# Patient Record
Sex: Female | Born: 1935 | Race: White | Hispanic: No | Marital: Married | Smoking: Never smoker
Health system: Southern US, Community
[De-identification: ages and names within clinical notes are randomized; demographics above are authoritative.]

## PROBLEM LIST (undated history)

## (undated) DIAGNOSIS — H409 Unspecified glaucoma: Secondary | ICD-10-CM

## (undated) DIAGNOSIS — I10 Essential (primary) hypertension: Secondary | ICD-10-CM

## (undated) DIAGNOSIS — I493 Ventricular premature depolarization: Secondary | ICD-10-CM

## (undated) DIAGNOSIS — K8020 Calculus of gallbladder without cholecystitis without obstruction: Secondary | ICD-10-CM

## (undated) DIAGNOSIS — K649 Unspecified hemorrhoids: Secondary | ICD-10-CM

## (undated) DIAGNOSIS — I4891 Unspecified atrial fibrillation: Secondary | ICD-10-CM

## (undated) DIAGNOSIS — K219 Gastro-esophageal reflux disease without esophagitis: Secondary | ICD-10-CM

## (undated) HISTORY — DX: Unspecified atrial fibrillation: I48.91

## (undated) HISTORY — DX: Unspecified hemorrhoids: K64.9

## (undated) HISTORY — DX: Essential (primary) hypertension: I10

## (undated) HISTORY — PX: CHOLECYSTECTOMY: SHX55

## (undated) HISTORY — PX: OTHER SURGICAL HISTORY: SHX169

## (undated) HISTORY — DX: Calculus of gallbladder without cholecystitis without obstruction: K80.20

## (undated) HISTORY — DX: Unspecified glaucoma: H40.9

## (undated) HISTORY — DX: Ventricular premature depolarization: I49.3

## (undated) HISTORY — DX: Gastro-esophageal reflux disease without esophagitis: K21.9

---

## 1972-09-07 HISTORY — PX: OTHER SURGICAL HISTORY: SHX169

## 1975-09-08 DIAGNOSIS — K8020 Calculus of gallbladder without cholecystitis without obstruction: Secondary | ICD-10-CM

## 1975-09-08 HISTORY — DX: Calculus of gallbladder without cholecystitis without obstruction: K80.20

## 1975-09-08 HISTORY — PX: BLADDER SURGERY: SHX569

## 1975-09-08 HISTORY — PX: OOPHORECTOMY: SHX86

## 2011-11-24 LAB — LIPID PANEL
Cholesterol: 168 mg/dL (ref 0–200)
HDL: 54 mg/dL (ref 35–70)
LDL Cholesterol: 99 mg/dL
Triglycerides: 95 mg/dL (ref 40–160)

## 2011-11-24 LAB — BASIC METABOLIC PANEL: Creatinine: 0.7 mg/dL (ref 0.5–1.1)

## 2011-11-24 LAB — HEPATIC FUNCTION PANEL: ALT: 17 U/L (ref 7–35)

## 2012-06-09 ENCOUNTER — Encounter: Payer: Self-pay | Admitting: Family Medicine

## 2012-06-09 ENCOUNTER — Ambulatory Visit (INDEPENDENT_AMBULATORY_CARE_PROVIDER_SITE_OTHER): Payer: Medicare Other | Admitting: Family Medicine

## 2012-06-09 VITALS — BP 168/90 | HR 80 | Ht 64.0 in | Wt 134.0 lb

## 2012-06-09 DIAGNOSIS — Z8679 Personal history of other diseases of the circulatory system: Secondary | ICD-10-CM | POA: Insufficient documentation

## 2012-06-09 DIAGNOSIS — I493 Ventricular premature depolarization: Secondary | ICD-10-CM | POA: Insufficient documentation

## 2012-06-09 DIAGNOSIS — Z23 Encounter for immunization: Secondary | ICD-10-CM

## 2012-06-09 DIAGNOSIS — H409 Unspecified glaucoma: Secondary | ICD-10-CM

## 2012-06-09 DIAGNOSIS — E538 Deficiency of other specified B group vitamins: Secondary | ICD-10-CM

## 2012-06-09 DIAGNOSIS — G43009 Migraine without aura, not intractable, without status migrainosus: Secondary | ICD-10-CM

## 2012-06-09 DIAGNOSIS — I4949 Other premature depolarization: Secondary | ICD-10-CM

## 2012-06-09 DIAGNOSIS — I4891 Unspecified atrial fibrillation: Secondary | ICD-10-CM

## 2012-06-09 DIAGNOSIS — D539 Nutritional anemia, unspecified: Secondary | ICD-10-CM

## 2012-06-09 DIAGNOSIS — R030 Elevated blood-pressure reading, without diagnosis of hypertension: Secondary | ICD-10-CM

## 2012-06-09 DIAGNOSIS — R252 Cramp and spasm: Secondary | ICD-10-CM

## 2012-06-09 MED ORDER — AMBULATORY NON FORMULARY MEDICATION: Status: DC

## 2012-06-09 NOTE — Progress Notes (Signed)
Subjective:    Patient ID: Kathleen Foster, female    DOB: 1931-08-13, 78 y.o.   MRN: [id number]  HPI Here to estab care.  She lives in a retirement community with her husband who has dementia, called [facility].  Has been having more foot cramps lately.  Tend to occur mostly at night. Will stand and put pressure on her foot adn it helps. Seem to be more frequent lately. No know exacerbating sxs.    She has a lot of questions about her vitamins and supplements. She says she has been B12 deficient in the past. She's also had low magnesium as well as potassium issues in the past. She just not sure what to take. Currently she is taking calcium with vitamin D, n, magnesium, and vitamin C.  She has ben under a lot of stress lately bc of taking care of her husband who has dementia.   Review of Systems  Constitutional: Negative for fever, diaphoresis and unexpected weight change.  HENT: Negative for hearing loss, rhinorrhea and tinnitus.   Eyes: Positive for visual disturbance.  Respiratory: Negative for cough and wheezing.   Cardiovascular: Negative for chest pain and palpitations.  Gastrointestinal: Negative for nausea, vomiting, diarrhea and blood in stool.  Genitourinary: Negative for vaginal bleeding, vaginal discharge and difficulty urinating.  Musculoskeletal: Negative for myalgias and arthralgias.  Skin: Negative for rash.  Neurological: Negative for headaches.  Hematological: Negative for adenopathy. Does not bruise/bleed easily.  Psychiatric/Behavioral: Positive for disturbed wake/sleep cycle. Negative for dysphoric mood. The patient is not nervous/anxious.    BP 168/90  Pulse 80  Ht 5\' 4"  (1.626 m)  Wt 134 lb (60.782 kg)  BMI 23.00 kg/m2    Allergies  Allergen Reactions  . Erythromycin     Vaginal yeast infections to mycins     Past Medical History  Diagnosis Date  . Gallstones 1977  . Glaucoma   . Atrial fibrillation   . PVC's (premature ventricular contractions)   .  Hemorrhoid   . GERD (gastroesophageal reflux disease)     Past Surgical History  Procedure Date  . Cyrosurgery for squamous cells 1974  . D & c for periods 1974, 1976, 1981  . Bladder surgery 1977    Chronic Pseudomebranous , urethritis, bladder neck, scar tissue  . Cholecystectomy   . Oophorectomy 1977    right ovary, exploratory for abd pain, ovary cyst    History   Social History  . Marital Status: Married    Spouse Name: Kathleen Foster    Number of Children: 2  . Years of Education: BA   Occupational History  . retired Horticulturist, commercial    Social History Main Topics  . Smoking status: Never Smoker   . Smokeless tobacco: Not on file  . Alcohol Use: No  . Drug Use: No  . Sexually Active: Not Currently   Other Topics Concern  . Not on file   Social History Narrative   Masters in [place] in [place].     Family History  Problem Relation Age of Onset  . Heart disease Mother   . Colon cancer Sister 71  . Stroke Father   . Diabetes      Uncle    Outpatient Encounter Prescriptions as of 06/09/2012  Medication Sig Dispense Refill  . ascorbic acid (VITAMIN C) 500 MG tablet Take 500 mg by mouth daily.      . B Complex-C (B-COMPLEX WITH VITAMIN C) tablet Take 1 tablet by mouth daily.      Marland Kitchen  Calcium Carbonate-Vitamin D (CALCIUM 600+D) 600-400 MG-UNIT per tablet Take 1 tablet by mouth 2 (two) times daily.      . magnesium 30 MG tablet Take 30 mg by mouth 2 (two) times daily.      . propranolol (INDERAL) 40 MG tablet Take 20 mg by mouth 2 (two) times daily.      . AMBULATORY NON FORMULARY MEDICATION Medication Name: Shingles vaccine IM x 1  1 vial  0          Objective:   Physical Exam  Constitutional: She is oriented to person, place, and time. She appears well-developed and well-nourished.  HENT:  Head: Normocephalic and atraumatic.  Neck: Neck supple. No thyromegaly present.  Cardiovascular: Normal rate, regular rhythm and normal heart sounds.        No carotid or abdominal  bruits.   Pulmonary/Chest: Effort normal and breath sounds normal.  Musculoskeletal: She exhibits no edema.  Lymphadenopathy:    She has no cervical adenopathy.  Neurological: She is alert and oriented to person, place, and time.  Skin: Skin is warm and dry.  Psychiatric: She has a normal mood and affect. Her behavior is normal.          Assessment & Plan:  PVCs are intermittent and rare. Otherwise not very bothersome but that is why she takes the propranolol. She's only been taking half a tab the propranolol. Because her blood pressures elevated today I did encourage her to take it twice a day they're normal she does not have a true diagnosis of hypertension.  Elevated blood pressure today-repeat was elevated. She says normally her blood pressures well controlled. We will certainly keep an eye on this. I would like to see her back in 6 weeks to make sure that it comes back down.  Foot cramps - will check potassium, electroytes and magnesium.  Could consider calcium channel blocker for sxs, esp if BP is still elevated at f/u appt.    Flu shot- given today  We discussed the shingles vaccine. Given the prescription to get the pharmacy sets is covered under her Medicare part D.  As far as her supplements are concerned we will check her vitamin levels. She did not take any of them today so that we can see if this is adequate or not. She may be fine with just a multivitamin and stiff multiple different supplements. Arthritis concern. She feels like it is helping with some hair thinning that she can certainly continue it but otherwise it would not spend the money.

## 2012-06-24 ENCOUNTER — Telehealth: Payer: Self-pay | Admitting: *Deleted

## 2012-07-04 ENCOUNTER — Encounter: Payer: Self-pay | Admitting: *Deleted

## 2012-07-20 LAB — COMPLETE METABOLIC PANEL WITH GFR
Alkaline Phosphatase: 59 U/L (ref 39–117)
BUN: 17 mg/dL (ref 6–23)
GFR, Est Non African American: 81 mL/min
Glucose, Bld: 98 mg/dL (ref 70–99)
Sodium: 142 mEq/L (ref 135–145)
Total Bilirubin: 0.5 mg/dL (ref 0.3–1.2)

## 2012-07-20 LAB — CBC WITH DIFFERENTIAL/PLATELET
Basophils Relative: 1 % (ref 0–1)
Eosinophils Absolute: 0.2 10*3/uL (ref 0.0–0.7)
Lymphs Abs: 1.1 10*3/uL (ref 0.7–4.0)
MCH: 30.4 pg (ref 26.0–34.0)
Neutrophils Relative %: 66 % (ref 43–77)
Platelets: 228 10*3/uL (ref 150–400)
RBC: 5.27 MIL/uL — ABNORMAL HIGH (ref 3.87–5.11)

## 2012-07-20 LAB — VITAMIN B12: Vitamin B-12: 579 pg/mL (ref 211–911)

## 2012-07-20 NOTE — Telephone Encounter (Signed)
See other note

## 2012-08-24 ENCOUNTER — Ambulatory Visit (INDEPENDENT_AMBULATORY_CARE_PROVIDER_SITE_OTHER): Payer: Medicare Other | Admitting: Family Medicine

## 2012-08-24 ENCOUNTER — Encounter: Payer: Self-pay | Admitting: Family Medicine

## 2012-08-24 ENCOUNTER — Other Ambulatory Visit: Payer: Self-pay | Admitting: Family Medicine

## 2012-08-24 VITALS — BP 148/85 | HR 64 | Ht 64.0 in | Wt 136.0 lb

## 2012-08-24 DIAGNOSIS — M533 Sacrococcygeal disorders, not elsewhere classified: Secondary | ICD-10-CM

## 2012-08-24 DIAGNOSIS — D582 Other hemoglobinopathies: Secondary | ICD-10-CM

## 2012-08-24 DIAGNOSIS — I493 Ventricular premature depolarization: Secondary | ICD-10-CM

## 2012-08-24 DIAGNOSIS — Z8679 Personal history of other diseases of the circulatory system: Secondary | ICD-10-CM

## 2012-08-24 DIAGNOSIS — I4949 Other premature depolarization: Secondary | ICD-10-CM

## 2012-08-24 LAB — CBC WITH DIFFERENTIAL/PLATELET
Basophils Absolute: 0.1 10*3/uL (ref 0.0–0.1)
Eosinophils Absolute: 0.4 10*3/uL (ref 0.0–0.7)
Eosinophils Relative: 7 % — ABNORMAL HIGH (ref 0–5)
HCT: 48.2 % — ABNORMAL HIGH (ref 36.0–46.0)
Lymphocytes Relative: 21 % (ref 12–46)
MCH: 30.7 pg (ref 26.0–34.0)
MCV: 93.6 fL (ref 78.0–100.0)
Monocytes Absolute: 0.7 10*3/uL (ref 0.1–1.0)
Platelets: 216 10*3/uL (ref 150–400)
RDW: 13.7 % (ref 11.5–15.5)
WBC: 6.6 10*3/uL (ref 4.0–10.5)

## 2012-08-24 NOTE — Progress Notes (Signed)
Subjective:    Patient ID: Kathleen Foster, female    DOB: 12-24-1930, 78 y.o.   MRN: [id number]  HPI Patient is here for followup today. She brought in a list of questions that she has concerns about. We did do some blood work last time because she wanted to know which supplements might be appropriate for her to take. She says she really doesn't like to take prescription medications and wants to stick with more natural supplements.  Has lots of question about her supplements.  She want to go over her lab results as well.    PVCs-She really wants to stop her propranolol bc doesn't feel she needs it.  She was on it for PVCs.  She feels it makes her very drowsy.  She said when she took brand Inderal she felt fine but when it was switched to propranolol generic several years ago she does not like it makes her feel. She. Rarely experiences any PVCs. She brought an article with her that reports that diuretics are better than beta blockers. She wants to discuss this.  2 weeks of pain in the left SI joint.  No trauma or injury. She does have a history for arthritis in her hands. She says it aches at times. She says certain movements will cause a more sharp and intense pain. She's not currently taking any pain relievers over-the-counter for this. She is mostly worried about inflammation in her body. She also wondering if there's any special diets she should follow. She admits in general she eats fairly healthy.   Review of Systems     Objective:   Physical Exam  Constitutional: She is oriented to person, place, and time. She appears well-developed and well-nourished.  HENT:  Head: Normocephalic and atraumatic.  Cardiovascular: Normal rate, regular rhythm and normal heart sounds.   Pulmonary/Chest: Effort normal and breath sounds normal.  Musculoskeletal: She exhibits no edema.       Low back with normal range of motion. She is tender over the left SI joint. Nontender over the lumbar spine.  Neurological:  She is alert and oriented to person, place, and time.  Skin: Skin is warm and dry.  Psychiatric: She has a normal mood and affect. Her behavior is normal.    Note, she is not in A. fib today. I'm not sure but thinks may be an old diagnosis and not a current diagnosis.      Assessment & Plan:  Answered questions about her supplements. I reviewed with her that she does not need to take a B complex as her B. levels were normal. In addition she can stop the magnesium and less she feels it is helping her foot cramps, but otherwise her levels were normal. Certainly she can continue the vitamin C and looser than one or if she would like for prevention of illness. I would like her to continue her calcium with vitamin D, especially because she gets almost no dietary calcium intake.  PVCs- Per her preference she wants to stop the Propranol. I explained her that my only concern with stopping the medication is that her blood pressure may go up. Her blood pressure was a little bit better when we rechecked at 10 minutes into the office visit. I asked her to have it checked with the nurse at [facility]. If we need to restart something I recommend starting with a diuretic or possibly an ACE inhibitor. Especially since she's noticed significant fatigue on the beta blocker.  Elevated BP-  I suspect that she may actually have hypertension though certainly it could be whitecoat hypertension. Repeat BP today was better. She will go nurse at [facility] to see if BP is well controlled in next 1-2 weeks and have her fax me a copy.   SI joint pain - Given H.O on stretches and exercises.  F/U in 1 months.  Recom NSAID such as Aleve or IBUprofen if tolerate. Stop if gastrointestinal problems.  If she still having pain at one month and we can investigate further with an x-ray.  Elevated hemoglobin-she says she's been told this in the past. She thinks it may just be her baseline. She is not a smoker. We will recheck her  level today.   Time spent 40 min, > 50% spent in counseling about supplements, PVCs, need to follow BP, and her SIjoint pain.

## 2012-09-01 ENCOUNTER — Telehealth: Payer: Self-pay | Admitting: *Deleted

## 2012-09-01 DIAGNOSIS — D582 Other hemoglobinopathies: Secondary | ICD-10-CM

## 2012-09-01 NOTE — Telephone Encounter (Signed)
[facility] called and states pt will need to have blood drawn for ferritin and iron because they only received lavender top tube

## 2012-09-02 NOTE — Telephone Encounter (Signed)
Not sure if new orders printed. Lets notify patient if not already done.

## 2012-09-22 ENCOUNTER — Telehealth: Payer: Self-pay | Admitting: *Deleted

## 2012-09-26 ENCOUNTER — Telehealth: Payer: Self-pay | Admitting: *Deleted

## 2012-09-26 NOTE — Telephone Encounter (Signed)
Pt calls stating her left eye is swollen,red, draining,some itching, watery.  Went to the clinic this morning at [facility], seen [facility] & was instructed by her that eye is infected & needs antibiotic.  Pt also states she has had a cold x2 days with head congestion & sneezing.  Please advise.

## 2012-09-26 NOTE — Telephone Encounter (Signed)
Needs appt

## 2012-09-27 ENCOUNTER — Encounter: Payer: Self-pay | Admitting: Family Medicine

## 2012-09-27 ENCOUNTER — Ambulatory Visit (INDEPENDENT_AMBULATORY_CARE_PROVIDER_SITE_OTHER): Payer: Medicare Other | Admitting: Family Medicine

## 2012-09-27 VITALS — BP 134/78 | HR 62 | Resp 18 | Wt 136.0 lb

## 2012-09-27 DIAGNOSIS — H01009 Unspecified blepharitis unspecified eye, unspecified eyelid: Secondary | ICD-10-CM

## 2012-09-27 DIAGNOSIS — H109 Unspecified conjunctivitis: Secondary | ICD-10-CM

## 2012-09-27 NOTE — Telephone Encounter (Signed)
Pt notified & is coming in to be seen.

## 2012-09-27 NOTE — Progress Notes (Signed)
  Subjective:    Patient ID: Kathleen Foster, female    DOB: 04-01-1931, 79 y.o.   MRN: [id number]  HPI 3 days of swollen eye lid,  She has had mucous drainage initially.  Not doing any treatments.  No change in vision. No fever.  Has had more sneezing for 2 days.  Saw nurse at her assisted living and told her to come in.l Having some crackling in her left ear.    Review of Systems     Objective:   Physical Exam  Constitutional: She is oriented to person, place, and time. She appears well-developed and well-nourished.  HENT:  Head: Normocephalic and atraumatic.  Right Ear: External ear normal.  Left Ear: External ear normal.  Nose: Nose normal.  Mouth/Throat: Oropharynx is clear and moist.       TMs and canals are clear. Left upper lid is mildly swollen with inflammed conjunctiva. No active d/c.   Eyes: Conjunctivae normal and EOM are normal. Pupils are equal, round, and reactive to light.  Neck: Neck supple. No thyromegaly present.  Cardiovascular: Normal rate, regular rhythm and normal heart sounds.   Pulmonary/Chest: Effort normal and breath sounds normal. She has no wheezes.  Lymphadenopathy:    She has no cervical adenopathy.  Neurological: She is alert and oriented to person, place, and time.  Skin: Skin is warm and dry.  Psychiatric: She has a normal mood and affect.          Assessment & Plan:  Blepharitis and conjunctivitis-could be viral vs bacterial.  She has some bacitracin eye ointment home. She was using on her legs about a month ago for blepharitis. She says that the prescription is fairly new and up-to-date. I recommended she start applying it on the eye every 4 hours for the next 5-7 days. If she's not noticing significant improvement in the next 24-48 hours and asked to call the office and we'll call in something different.

## 2012-09-27 NOTE — Patient Instructions (Addendum)
Please apply the bacitracin eye ointment every 4 hours Recommend Dr. Jackquline Bosch, optometrist here in [place].   Blepharitis Blepharitis is redness, soreness, and swelling (inflammation) of one or both eyelids. It may be caused by an allergic reaction or a bacterial infection. Blepharitis may also be associated with reddened, scaly skin (seborrhea) of the scalp and eyebrows. While you sleep, eye discharge may cause your eyelashes to stick together. Your eyelids may itch, burn, swell, and may lose their lashes. These will grow back. Your eyes may become sensitive. Blepharitis may recur and need repeated treatment. If this is the case, you may require further evaluation by an eye specialist (ophthalmologist). HOME CARE INSTRUCTIONS    Keep your hands clean.   Use a clean towel each time you dry your eyelids. Do not use this towel to clean other areas. Do not share a towel or makeup with anyone.   Wash your eyelids with warm water or warm water mixed with a small amount of baby shampoo. Do this twice a day or as often as needed.   Wash your face and eyebrows at least once a day.   Use warm compresses 2 times a day for 10 minutes at a time, or as directed by your caregiver.   Apply antibiotic ointment as directed by your caregiver.   Avoid rubbing your eyes.   Avoid wearing makeup until you get better.   Follow up with your caregiver as directed.  SEEK IMMEDIATE MEDICAL CARE IF:    You have pain, redness, or swelling that gets worse or spreads to other parts of your face.   Your vision changes, or you have pain when looking at lights or moving objects.   You have a fever.   Your symptoms continue for longer than 2 to 4 days or become worse.  MAKE SURE YOU:    Understand these instructions.   Will watch your condition.   Will get help right away if you are not doing well or get worse.  Document Released: 08/21/2000 Document Revised: 11/16/2011 Document Reviewed:  10/01/2010 [facility] Patient Information 2013 [place], [place].   Conjunctivitis Conjunctivitis is commonly called "pink eye." Conjunctivitis can be caused by bacterial or viral infection, allergies, or injuries. There is usually redness of the lining of the eye, itching, discomfort, and sometimes discharge. There may be deposits of matter along the eyelids. A viral infection usually causes a watery discharge, while a bacterial infection causes a yellowish, thick discharge. Pink eye is very contagious and spreads by direct contact. You may be given antibiotic eyedrops as part of your treatment. Before using your eye medicine, remove all drainage from the eye by washing gently with warm water and cotton balls. Continue to use the medication until you have awakened 2 mornings in a row without discharge from the eye. Do not rub your eye. This increases the irritation and helps spread infection. Use separate towels from other household members. Wash your hands with soap and water before and after touching your eyes. Use cold compresses to reduce pain and sunglasses to relieve irritation from light. Do not wear contact lenses or wear eye makeup until the infection is gone. SEEK MEDICAL CARE IF:    Your symptoms are not better after 3 days of treatment.   You have increased pain or trouble seeing.   The outer eyelids become very red or swollen.  Document Released: 10/01/2004 Document Revised: 11/16/2011 Document Reviewed: 08/24/2005 [facility] Patient Information 2013 [place], [place].

## 2012-09-28 LAB — FERRITIN: Ferritin: 126 ng/mL (ref 10–291)

## 2012-09-28 LAB — IRON: Iron: 23 ug/dL — ABNORMAL LOW (ref 42–145)

## 2012-10-24 ENCOUNTER — Other Ambulatory Visit: Payer: Self-pay

## 2012-10-24 MED ORDER — PROPRANOLOL HCL 40 MG PO TABS: 20.0000 mg | ORAL_TABLET | Freq: Two times a day (BID) | ORAL | Status: DC

## 2012-11-08 ENCOUNTER — Telehealth: Payer: Self-pay | Admitting: *Deleted

## 2012-11-08 NOTE — Telephone Encounter (Signed)
Pt calls and states her BP was checked at [facility] where she lives and yesterday it was 174/102 and today its 162/102. Taking Propanolol 20mg  daily. ? If needs to increase it or not. Please advise

## 2012-11-09 NOTE — Telephone Encounter (Signed)
Pt.notified

## 2012-11-09 NOTE — Telephone Encounter (Signed)
Yes, increase to whole tab (40mg ) twice a day adn see how bp is doing early next week.

## 2013-01-02 ENCOUNTER — Other Ambulatory Visit: Payer: Self-pay | Admitting: *Deleted

## 2013-01-02 MED ORDER — PROPRANOLOL HCL 40 MG PO TABS: 40.0000 mg | ORAL_TABLET | Freq: Two times a day (BID) | ORAL | Status: DC

## 2013-03-07 ENCOUNTER — Ambulatory Visit (INDEPENDENT_AMBULATORY_CARE_PROVIDER_SITE_OTHER): Payer: Medicare Other | Admitting: Family Medicine

## 2013-03-07 ENCOUNTER — Encounter: Payer: Self-pay | Admitting: Family Medicine

## 2013-03-07 VITALS — BP 136/80 | HR 66 | Wt 140.0 lb

## 2013-03-07 DIAGNOSIS — D509 Iron deficiency anemia, unspecified: Secondary | ICD-10-CM

## 2013-03-07 DIAGNOSIS — L989 Disorder of the skin and subcutaneous tissue, unspecified: Secondary | ICD-10-CM

## 2013-03-07 DIAGNOSIS — R5383 Other fatigue: Secondary | ICD-10-CM

## 2013-03-07 DIAGNOSIS — H4020X0 Unspecified primary angle-closure glaucoma, stage unspecified: Secondary | ICD-10-CM | POA: Insufficient documentation

## 2013-03-07 DIAGNOSIS — H409 Unspecified glaucoma: Secondary | ICD-10-CM

## 2013-03-07 DIAGNOSIS — I10 Essential (primary) hypertension: Secondary | ICD-10-CM

## 2013-03-07 DIAGNOSIS — R5381 Other malaise: Secondary | ICD-10-CM

## 2013-03-07 DIAGNOSIS — E611 Iron deficiency: Secondary | ICD-10-CM

## 2013-03-07 MED ORDER — LOSARTAN POTASSIUM 25 MG PO TABS: 25.0000 mg | ORAL_TABLET | Freq: Every day | ORAL | Status: DC

## 2013-03-07 NOTE — Patient Instructions (Addendum)
Enourage you to get the shingles vaccine. Stop your propranolol and start the losartan in the AM. We will call you with your lab results.

## 2013-03-07 NOTE — Progress Notes (Signed)
  Subjective:    Patient ID: Kathleen Foster, female    DOB: Jan 11, 1931, 79 y.o.   MRN: [id number]  HPI HTN - Brought in BP log.  Most pressure are running 130-170. She really wants to stop her propranol.  Make her really fatigued adn groggy.   Pt denies chest pain, SOB, dizziness, or heart palpitations.  Taking meds as directed w/o problems.  Denies medication side effects.    Says hasn't really been doing the exercising  For her SI. Overall better but still coming and going. Occ gets pain radiating down the leg.   Has lots of questions about her supplements. Wants to start glucosamine chondroitin for her OA.    She has some new bumps on her forehead and will refer to Dermatology.  No newmakeup, soaps, lotions, etc. Not itchey.  No hx of skin cancer.    She requests heavy metal testing and has questions about chelation therapy.    Review of Systems     Objective:   Physical Exam  Constitutional: She is oriented to person, place, and time. She appears well-developed and well-nourished.  HENT:  Head: Normocephalic and atraumatic.  Cardiovascular: Normal rate, regular rhythm and normal heart sounds.   Pulmonary/Chest: Effort normal and breath sounds normal.  Neurological: She is alert and oriented to person, place, and time.  Skin: Skin is warm and dry.  She has several tan colored lesion on her forehead that are consistant with seborrheic keratoses. I do not see any that have a pearly appearance of the basal cell.  Psychiatric: She has a normal mood and affect. Her behavior is normal.          Assessment & Plan:  HTN- BPs really up and down. Well contorlled her today. Will stop propranol bc of S.E. Will start losartan in stead. F/U in 1-2 months to recheck BP. She says she's had a reaction to lisinopril in the past but says she can't quite remember what it was.  Skin rash on forehead - most consistent with seborrheic keratoses but will refer to Dermatology.   Will get heavy metal  testing per patient request. We'll also check her B12, B6, B1 and folate. She does have a history of low iron so we'll repeat this as well.  Time spent 30 min, > 50% spent counseling. About her blood pressure, OA, and her supplements.   SI joint pain - encourage her to try the exercises as I do think that this could help. She does occasionally get some pain radiating down the leg then this could be more sciatic in nature.

## 2013-03-08 ENCOUNTER — Other Ambulatory Visit: Payer: Self-pay | Admitting: *Deleted

## 2013-03-08 LAB — COMPLETE METABOLIC PANEL WITH GFR
ALT: 17 U/L (ref 0–35)
AST: 21 U/L (ref 0–37)
Albumin: 4.5 g/dL (ref 3.5–5.2)
CO2: 29 mEq/L (ref 19–32)
Calcium: 9.6 mg/dL (ref 8.4–10.5)
Chloride: 106 mEq/L (ref 96–112)
GFR, Est African American: >89 mL/min
Potassium: 5.2 mEq/L (ref 3.5–5.3)

## 2013-03-08 LAB — LIPID PANEL
LDL Cholesterol: 130 mg/dL — ABNORMAL HIGH (ref 0–99)
VLDL: 35 mg/dL (ref 0–40)

## 2013-03-08 LAB — FOLATE: Folate: >20 ng/mL

## 2013-03-08 LAB — MAGNESIUM: Magnesium: 2.2 mg/dL (ref 1.5–2.5)

## 2013-03-08 LAB — VITAMIN B12: Vitamin B-12: 440 pg/mL (ref 211–911)

## 2013-03-08 MED ORDER — MAGNESIUM 250 MG PO TABS: 250.0000 mg | ORAL_TABLET | Freq: Every day | ORAL | Status: DC

## 2013-03-08 MED ORDER — POTASSIUM CHLORIDE ER 10 MEQ PO TBCR: 10.0000 meq | EXTENDED_RELEASE_TABLET | Freq: Every day | ORAL | Status: DC

## 2013-03-10 LAB — HEAVY METALS, BLOOD: Arsenic: <3 mcg/L (ref <23)

## 2013-03-10 LAB — VITAMIN B1: Vitamin B1 (Thiamine): 22 nmol/L (ref 8–30)

## 2013-03-11 LAB — VITAMIN B6: Vitamin B6: 32.8 ng/mL — ABNORMAL HIGH (ref 2.1–21.7)

## 2013-03-29 ENCOUNTER — Telehealth: Payer: Self-pay

## 2013-03-29 NOTE — Telephone Encounter (Signed)
Kathleen Foster started the Losartan and the side effects, per patient, was leg pain and diarrhea. She stopped the medication on her own. Today her blood pressure was 140/106. She restarted the propranolol 20 mg, on her own.  Per:  Heather from [facility]  [phone]  Please advise  

## 2013-04-03 ENCOUNTER — Telehealth: Payer: Self-pay

## 2013-04-03 NOTE — Telephone Encounter (Signed)
Yes she wants to stay on the propranolol 20 mg bid. She will check her blood pressure through out the week.

## 2013-04-03 NOTE — Telephone Encounter (Signed)
Ms. Kathleen Foster started the Losartan and the side effects, per patient, was leg pain and diarrhea. She stopped the medication on her own. Today her blood pressure was 140/106. She restarted the propranolol 20 mg, on her own.  Per:  Herbert Seta from [facility]  [phone]  Please advise

## 2013-04-03 NOTE — Telephone Encounter (Signed)
Heather called and left a message on nurse line asking for a return call.   Returned Call: Left message asking patient to call back.

## 2013-04-03 NOTE — Telephone Encounter (Signed)
Does she want to stay on this for now, or does she want to try something different?

## 2013-06-21 ENCOUNTER — Telehealth: Payer: Self-pay | Admitting: *Deleted

## 2013-06-21 NOTE — Telephone Encounter (Signed)
Pt called and stated that her blood pressures have been low and wanted to know what adjustments she should make to her meds.Kathleen Foster

## 2013-06-21 NOTE — Telephone Encounter (Signed)
Hold the losartan adn then f/u in 1 week for BP check

## 2013-06-22 NOTE — Telephone Encounter (Signed)
Pt stated that she has not taken her losartan in a long time (july 20) she is now taking Amlodipine 2.5 mg daily and propranolol 20mg  BID instead of 40 mg BID per Dr. Judithe Modest he changed this on 06/07/13. I told her that since he was the one who made this change with her meds then she should speak to him about her blood pressure issues. She would still like to come in and discuss this with Dr.metheney because she has questions about her supplements that she takes also appt made for 10.28.2014 @ 915 for .Loralee Pacas [place]

## 2013-06-23 NOTE — Telephone Encounter (Signed)
Sounds good. Thank you

## 2013-07-04 ENCOUNTER — Ambulatory Visit (INDEPENDENT_AMBULATORY_CARE_PROVIDER_SITE_OTHER): Payer: Medicare Other | Admitting: Family Medicine

## 2013-07-04 ENCOUNTER — Encounter: Payer: Self-pay | Admitting: Family Medicine

## 2013-07-04 VITALS — BP 142/78 | HR 67 | Wt 138.0 lb

## 2013-07-04 DIAGNOSIS — R0789 Other chest pain: Secondary | ICD-10-CM

## 2013-07-04 DIAGNOSIS — L293 Anogenital pruritus, unspecified: Secondary | ICD-10-CM

## 2013-07-04 DIAGNOSIS — I10 Essential (primary) hypertension: Secondary | ICD-10-CM

## 2013-07-04 DIAGNOSIS — H409 Unspecified glaucoma: Secondary | ICD-10-CM

## 2013-07-04 DIAGNOSIS — D179 Benign lipomatous neoplasm, unspecified: Secondary | ICD-10-CM

## 2013-07-04 DIAGNOSIS — H4020X0 Unspecified primary angle-closure glaucoma, stage unspecified: Secondary | ICD-10-CM

## 2013-07-04 MED ORDER — PROPRANOLOL HCL 40 MG PO TABS: ORAL_TABLET | ORAL | Status: DC

## 2013-07-04 NOTE — Patient Instructions (Addendum)
Do not need to take magnesium. Do not need to take B6. Do not need to take B12. Ok to take biotin.   Take the propanol for 40mg  in the morning and 20mg  in the evening.  Bring in blood pressure log.

## 2013-07-04 NOTE — Progress Notes (Signed)
Subjective:    Patient ID: Kathleen Foster, female    DOB: [birth year], 79 y.o.   MRN: [id number]  HPI  Here today because has concernes about her medications. Previously on propranolol 40 mg. She went to see her cardiologist, Dr. followup. She felt poorly on that dosing regimen so he is encouraged her to cut back to 20 mg twice a day and have added amlodipine 2.5 mg per day for better control. She did take the amlodipine for approximately 3 weeks and then discontinued it after she read that it was contraindicated with glaucoma and with the propranolol. She discontinued hydrochlorothiazide previously because of concerns for possible glaucoma. She also has several questions about her supplements.stopped her potassium.  She is also worried the amlodipine may affect memory.    Had a chest pressure in the morning.  Felt heavy.  Lasted 5- 10 min and was at rest. No nausea or diapphoresis. Says has had on and off before.  Had EKG done with cards and doesn't want to have it done.  No cough or SOB.    She also says that she had some perineal itching on and off. She does not currently have it. She wondered if she might benefit from medication for parasites but wasn't sure. She mentioned a medication that I had never heard of before. Then she asked about possible pinworms. The she denies that the itching is worse at night. She says she was given an antifungal, I believe that this was recommended by the nurse at [facility], and says that it did help.  She was also recently told that she had a lipoma. It does not bother her. It's not painful. She said she only recently noticed it has never had anything like this before. She was told to followup if it becomes hard or tender or starts causing symptoms.  Review of Systems     Objective:   Physical Exam  Constitutional: She is oriented to person, place, and time. She appears well-developed and well-nourished.  HENT:  Head: Normocephalic and atraumatic.  Neck:  Neck supple. No thyromegaly present.  Cardiovascular: Normal rate, regular rhythm and normal heart sounds.   No carotid bruits  Pulmonary/Chest: Effort normal and breath sounds normal.  Neurological: She is alert and oriented to person, place, and time.  Skin: Skin is warm and dry.  Psychiatric: She has a normal mood and affect. Her behavior is normal.          Assessment & Plan:  Hypertension-under fair control. She did bring in her blood pressures that have been done at [facility]. Most of them are at goal of systolic pressure under 150 and diastolic pressure under 90. I think at least once or twice a week ago she had an elevated but pressure reading in the 150 range. We discussed taking 40 mg of propranolol the morning and 20 mg at bedtime and seeing if she feels better on this regimen and if still controls her blood pressure. She is adamantly against taking amlodipine. I did add this to her intolerance list. Though I did reassure her that this should not affect her glaucoma or memory in any way. We did discuss considering a trial of a different calcium channel blocker if the change in dose in the propranolol is still not controlling her symptoms. Propanolol 40 mg twice a day typically controls or pressure she just doesn't feel as good on it at a lower dose. Certainly she can address this with her cardiologist as  well.  Medication review-we did go over all her supplements. I wrote down instructions for her as far as what she can discontinue. She are to stop the potassium on her own so we can recheck her potassium in about a week or so off of it to make sure that she's maintaining her potassium levels on her own.  Perineal itching-unclear etiology. She's not currently have the symptoms and has not had them recently. I discussed the am not clear which type of antifungal she is talking about. Never hurt or that and have never prescribed it. As far as pinworms is concerned I tried to reassure her  that her symptoms do not sound consistent with pinworms but certainly if her symptoms recur she can use a piece of clear tape to try to collect a sample and bring it in in a zip lock bag.  Lipoma-gave her reassurance that these are benign lesions. Offer to look at the one on her left buttock area but she declined.  Atypical chest pain-she does not seem to be alarmed by the discomfort that she had this morning. She says it happens occasionally. She feels comfortable that she recently saw her cardiologist and was doing well. Offer to do an EKG today but she declined. She's not currently having symptoms.  Time spent 35 minutes to person time spent counseling about blood pressure, medications, perineal itching, and lipoma.

## 2013-08-08 ENCOUNTER — Ambulatory Visit (INDEPENDENT_AMBULATORY_CARE_PROVIDER_SITE_OTHER): Payer: Medicare Other | Admitting: Family Medicine

## 2013-08-08 ENCOUNTER — Encounter: Payer: Self-pay | Admitting: Family Medicine

## 2013-08-08 ENCOUNTER — Telehealth: Payer: Self-pay | Admitting: Family Medicine

## 2013-08-08 VITALS — BP 163/80 | HR 60 | Temp 97.7°F | Ht 64.0 in | Wt 142.0 lb

## 2013-08-08 DIAGNOSIS — Z0000 Encounter for general adult medical examination without abnormal findings: Secondary | ICD-10-CM

## 2013-08-08 DIAGNOSIS — I10 Essential (primary) hypertension: Secondary | ICD-10-CM

## 2013-08-08 DIAGNOSIS — R300 Dysuria: Secondary | ICD-10-CM

## 2013-08-08 DIAGNOSIS — E785 Hyperlipidemia, unspecified: Secondary | ICD-10-CM

## 2013-08-08 LAB — POCT URINALYSIS DIPSTICK
Bilirubin, UA: NEGATIVE
Blood, UA: NEGATIVE
Glucose, UA: NEGATIVE
Ketones, UA: NEGATIVE
Leukocytes, UA: NEGATIVE
Nitrite, UA: NEGATIVE
Spec Grav, UA: <=1.005
Urobilinogen, UA: 0.2

## 2013-08-08 MED ORDER — PROPRANOLOL HCL 20 MG PO TABS: 20.0000 mg | ORAL_TABLET | Freq: Two times a day (BID) | ORAL | Status: DC

## 2013-08-08 NOTE — Telephone Encounter (Signed)
Pt came by. She is requesting that instead of leaving a message on her phone make sure you speak with her as her husband has Dementia and she does not always understand what he has written.  Thanks.

## 2013-08-08 NOTE — Telephone Encounter (Signed)
Noted to speak w/pt's wife.Kathleen Foster

## 2013-08-08 NOTE — Patient Instructions (Signed)
Fat and Cholesterol Control Diet  Fat and cholesterol levels in your blood and organs are influenced by your diet. High levels of fat and cholesterol may lead to diseases of the heart, small and large blood vessels, gallbladder, liver, and pancreas.  CONTROLLING FAT AND CHOLESTEROL WITH DIET  Although exercise and lifestyle factors are important, your diet is key. That is because certain foods are known to raise cholesterol and others to lower it. The goal is to balance foods for their effect on cholesterol and more importantly, to replace saturated and trans fat with other types of fat, such as monounsaturated fat, polyunsaturated fat, and omega-3 fatty acids.  On average, a person should consume no more than 15 to 17 g of saturated fat daily. Saturated and trans fats are considered "bad" fats, and they will raise LDL cholesterol. Saturated fats are primarily found in animal products such as meats, butter, and cream. However, that does not mean you need to give up all your favorite foods. Today, there are good tasting, low-fat, low-cholesterol substitutes for most of the things you like to eat. Choose low-fat or nonfat alternatives. Choose round or loin cuts of red meat. These types of cuts are lowest in fat and cholesterol. Chicken (without the skin), fish, veal, and ground turkey breast are great choices. Eliminate fatty meats, such as hot dogs and salami. Even shellfish have little or no saturated fat. Have a 3 oz (85 g) portion when you eat lean meat, poultry, or fish.  Trans fats are also called "partially hydrogenated oils." They are oils that have been scientifically manipulated so that they are solid at room temperature resulting in a longer shelf life and improved taste and texture of foods in which they are added. Trans fats are found in stick margarine, some tub margarines, cookies, crackers, and baked goods.   When baking and cooking, oils are a great substitute for butter. The monounsaturated oils are  especially beneficial since it is believed they lower LDL and raise HDL. The oils you should avoid entirely are saturated tropical oils, such as coconut and palm.   Remember to eat a lot from food groups that are naturally free of saturated and trans fat, including fish, fruit, vegetables, beans, grains (barley, rice, couscous, bulgur wheat), and pasta (without cream sauces).   IDENTIFYING FOODS THAT LOWER FAT AND CHOLESTEROL   Soluble fiber may lower your cholesterol. This type of fiber is found in fruits such as apples, vegetables such as broccoli, potatoes, and carrots, legumes such as beans, peas, and lentils, and grains such as barley. Foods fortified with plant sterols (phytosterol) may also lower cholesterol. You should eat at least 2 g per day of these foods for a cholesterol lowering effect.   Read package labels to identify low-saturated fats, trans fat free, and low-fat foods at the supermarket. Select cheeses that have only 2 to 3 g saturated fat per ounce. Use a heart-healthy tub margarine that is free of trans fats or partially hydrogenated oil. When buying baked goods (cookies, crackers), avoid partially hydrogenated oils. Breads and muffins should be made from whole grains (whole-wheat or whole oat flour, instead of "flour" or "enriched flour"). Buy non-creamy canned soups with reduced salt and no added fats.   FOOD PREPARATION TECHNIQUES   Never deep-fry. If you must fry, either stir-fry, which uses very little fat, or use non-stick cooking sprays. When possible, broil, bake, or roast meats, and steam vegetables. Instead of putting butter or margarine on vegetables, use lemon   and herbs, applesauce, and cinnamon (for squash and sweet potatoes). Use nonfat yogurt, salsa, and low-fat dressings for salads.   LOW-SATURATED FAT / LOW-FAT FOOD SUBSTITUTES  Meats / Saturated Fat (g)  · Avoid: Steak, marbled (3 oz/85 g) / 11 g  · Choose: Steak, lean (3 oz/85 g) / 4 g  · Avoid: Hamburger (3 oz/85 g) / 7  g  · Choose: Hamburger, lean (3 oz/85 g) / 5 g  · Avoid: Ham (3 oz/85 g) / 6 g  · Choose: Ham, lean cut (3 oz/85 g) / 2.4 g  · Avoid: Chicken, with skin, dark meat (3 oz/85 g) / 4 g  · Choose: Chicken, skin removed, dark meat (3 oz/85 g) / 2 g  · Avoid: Chicken, with skin, light meat (3 oz/85 g) / 2.5 g  · Choose: Chicken, skin removed, light meat (3 oz/85 g) / 1 g  Dairy / Saturated Fat (g)  · Avoid: Whole milk (1 cup) / 5 g  · Choose: Low-fat milk, 2% (1 cup) / 3 g  · Choose: Low-fat milk, 1% (1 cup) / 1.5 g  · Choose: Skim milk (1 cup) / 0.3 g  · Avoid: Hard cheese (1 oz/28 g) / 6 g  · Choose: Skim milk cheese (1 oz/28 g) / 2 to 3 g  · Avoid: Cottage cheese, 4% fat (1 cup) / 6.5 g  · Choose: Low-fat cottage cheese, 1% fat (1 cup) / 1.5 g  · Avoid: Ice cream (1 cup) / 9 g  · Choose: Sherbet (1 cup) / 2.5 g  · Choose: Nonfat frozen yogurt (1 cup) / 0.3 g  · Choose: Frozen fruit bar / trace  · Avoid: Whipped cream (1 tbs) / 3.5 g  · Choose: Nondairy whipped topping (1 tbs) / 1 g  Condiments / Saturated Fat (g)  · Avoid: Mayonnaise (1 tbs) / 2 g  · Choose: Low-fat mayonnaise (1 tbs) / 1 g  · Avoid: Butter (1 tbs) / 7 g  · Choose: Extra light margarine (1 tbs) / 1 g  · Avoid: Coconut oil (1 tbs) / 11.8 g  · Choose: Olive oil (1 tbs) / 1.8 g  · Choose: Corn oil (1 tbs) / 1.7 g  · Choose: Safflower oil (1 tbs) / 1.2 g  · Choose: Sunflower oil (1 tbs) / 1.4 g  · Choose: Soybean oil (1 tbs) / 2.4 g  · Choose: Canola oil (1 tbs) / 1 g  Document Released: 08/24/2005 Document Revised: 12/19/2012 Document Reviewed: 02/12/2011  Exit[employer]® Patient Information ©2014 [employer].

## 2013-08-08 NOTE — Progress Notes (Addendum)
Subjective:    Kathleen Foster is a 79 y.o. female who presents for Medicare Annual/Subsequent preventive examination.  Preventive Screening-Counseling & Management  Tobacco History  Smoking status  . Never Smoker   Smokeless tobacco  . Not on file     Problems Prior to Visit 1. she still having pain at the SI joint on the left. An opportunity for her previously. She still declines. Pressure work on regular exercise if it continues to bother her I be happy to refer her for possible injection. 2. she complains of a sharp pain in her shoulder. It occurred one morning after she woke up. It lasted about a day or so and then resolved. She's not had any pounds her pain since then. No limited range of motion.  Current Problems (verified) Patient Active Problem List   Diagnosis Date Noted  . Closed angle glaucoma 03/07/2013  . Essential hypertension, benign 03/07/2013  . Glaucoma 06/09/2012  . History of atrial fibrillation 06/09/2012  . PVC (premature ventricular contraction) 06/09/2012  . Migraine headache without aura 06/09/2012    Medications Prior to Visit Current Outpatient Prescriptions on File Prior to Visit  Medication Sig Dispense Refill  . ascorbic acid (VITAMIN C) 500 MG tablet Take 500 mg by mouth as needed.       . Coenzyme Q10 (CO Q 10 PO) Take 1 tablet by mouth daily.      . Magnesium 250 MG TABS Take 1 tablet (250 mg total) by mouth daily.    0  . calcium citrate-vitamin D (CITRACAL+D) 315-200 MG-UNIT per tablet Take 1 tablet by mouth 2 (two) times daily.      . Multiple Vitamins-Minerals (MULTIVITAMIN PO) Take 1 capsule by mouth daily.       No current facility-administered medications on file prior to visit.    Current Medications (verified) Current Outpatient Prescriptions  Medication Sig Dispense Refill  . ascorbic acid (VITAMIN C) 500 MG tablet Take 500 mg by mouth as needed.       . Coenzyme Q10 (CO Q 10 PO) Take 1 tablet by mouth daily.      . Magnesium 250  MG TABS Take 1 tablet (250 mg total) by mouth daily.    0  . calcium citrate-vitamin D (CITRACAL+D) 315-200 MG-UNIT per tablet Take 1 tablet by mouth 2 (two) times daily.      . Multiple Vitamins-Minerals (MULTIVITAMIN PO) Take 1 capsule by mouth daily.      . propranolol (INDERAL) 40 MG tablet 40 mg. 1/2 in AM and 1/2 tab in PM       No current facility-administered medications for this visit.     Allergies (verified) Amlodipine; Erythromycin; Lisinopril; Losartan; and Hydrochlorothiazide   PAST HISTORY  Family History Family History  Problem Relation Age of Onset  . Heart disease Mother   . Colon cancer Sister 43  . Stroke Father   . Diabetes      Uncle    Social History History  Substance Use Topics  . Smoking status: Never Smoker   . Smokeless tobacco: Not on file  . Alcohol Use: No     Are there smokers in your home (other than you)? No  Risk Factors Current exercise habits: some exercise  Dietary issues discussed: wants to know what supplement she should take, brought in a list that she wanted to ask about it.     Cardiac risk factors: advanced age (older than 44 for men, 54 for women).  Depression Screen (Note:  if answer to either of the following is "Yes", a more complete depression screening is indicated)   Over the past two weeks, have you felt down, depressed or hopeless? No  Over the past two weeks, have you felt little interest or pleasure in doing things? No  Have you lost interest or pleasure in daily life? No  Do you often feel hopeless? No  Do you cry easily over simple problems? No  Activities of Daily Living In your present state of health, do you have any difficulty performing the following activities?:  Driving? Yes Managing money?  No Feeding yourself? No Getting from bed to chair? No Climbing a flight of stairs? No Preparing food and eating?: No Bathing or showering? No Getting dressed: No Getting to the toilet? No Using the  toilet:No Moving around from place to place: No In the past year have you fallen or had a near fall?:No   Are you sexually active?  No  Do you have more than one partner?  No  Hearing Difficulties: No Do you often ask people to speak up or repeat themselves? No Do you experience ringing or noises in your ears? No Do you have difficulty understanding soft or whispered voices? Yes   Do you feel that you have a problem with memory? No  Do you often misplace items? Yes  Do you feel safe at home?  Yes  Cognitive Testing  Alert? Yes  Normal Appearance?Yes  Oriented to person? Yes  Place? Yes   Time? Yes  Recall of three objects?  Yes  Can perform simple calculations? Yes  Displays appropriate judgment?Yes  Can read the correct time from a watch face?Yes   Advanced Directives have been discussed with the patient? Yes  List the Names of Other Physician/Practitioners you currently use: 1.    Indicate any recent Medical Services you may have received from other than [facility] providers in the past year (date may be approximate).  Immunization History  Administered Date(s) Administered  . Influenza Split 06/09/2012  . Influenza-Unspecified 06/08/2013    Screening Tests Health Maintenance  Topic Date Due  . Tetanus/tdap  [birth year]/1951  . Zostavax  10/20/1992  . Pneumococcal Polysaccharide Vaccine Age 1 And Over  10/20/1997  . Influenza Vaccine  04/07/2014    All answers were reviewed with the patient and necessary referrals were made:  Rebekah Sprinkle, MD   08/08/2013   History reviewed: allergies, current medications, past family history, past medical history, past social history, past surgical history and problem list  Review of Systems A comprehensive review of systems was negative.    Objective:     Vision by Snellen chart: Follows regularly with Dr. Jackquline Bosch.  Body mass index is 24.36 kg/(m^2). BP 163/80  Pulse 60  Temp(Src) 97.7 F (36.5 C)  Ht 5\' 4"  (1.626 m)   Wt 142 lb (64.411 kg)  BMI 24.36 kg/m2  BP 163/80  Pulse 60  Temp(Src) 97.7 F (36.5 C)  Ht 5\' 4"  (1.626 m)  Wt 142 lb (64.411 kg)  BMI 24.36 kg/m2  General Appearance:    Alert, cooperative, no distress, appears stated age  Head:    Normocephalic, without obvious abnormality, atraumatic  Eyes:    PERRL, conjunctiva/corneas clear, EOM's intact,both eyes  Ears:    Normal TM's and external ear canals, both ears  Nose:   Nares normal, septum midline, mucosa normal, no drainage    or sinus tenderness  Throat:   Lips, mucosa, and tongue normal; teeth and  gums normal  Neck:   Supple, symmetrical, trachea midline, no adenopathy;    thyroid:  no enlargement/tenderness/nodules; no carotid   bruit or JVD  Back:     Symmetric, no curvature, ROM normal, no CVA tenderness  Lungs:     Clear to auscultation bilaterally, respirations unlabored  Chest Wall:    No tenderness or deformity   Heart:    Regular rate and rhythm, S1 and S2 normal, no murmur, rub   or gallop  Breast Exam:    Not performed  Abdomen:     Soft, non-tender, bowel sounds active all four quadrants,    no masses, no organomegaly. surgical scar is well-healed.   Genitalia:    Normal female without lesion, she does have a lipoma on the right inner buttock cheeck.   Rectal:    Not performed.   Extremities:   Extremities normal, atraumatic, no cyanosis or edema  Pulses:   2+ and symmetric all extremities  Skin:   Skin color, texture, turgor normal, no rashes or lesions  Lymph nodes:   Cervical, supraclavicular nodes normal  Neurologic:   CNII-XII intact, normal strength, sensation and reflexes    throughout       Assessment:     Medicare in the wellness exam     Plan:     During the course of the visit the patient was educated and counseled about appropriate screening and preventive services including:    Td vaccine  shingles vaccine - not interest.  Tdap can only be done at pharmacy.   Nutritional supplements-I  went over each of supplements with her. There were a few including by 10 that I felt were safe. I encouraged her not to take any extra vitamin D as there is Re: a daily recommended dose and her multivitamin and her levels were adequate over the summer. She also wants and if it was okay to take potassium and I said no. Again her last levels were normal, in fact on the higher end of normal.  Keep regular eye exams.  Hypertension-she is trying to wean off of the propranolol. She feels like it causes side effects. She's tried multiple other agents that she also feels side effects. Her biggest concern is the worsening of her glaucoma that she feels could be triggered by the propranolol. We also discussed that not treating her blood pressure does increase her risk of stroke and heart attack, which is a very real risk. Right now she is down to 20 mg twice a day  Hyperlipidemia-she will be some additional information on a low-fat diet. Her cholesterol is up from last year. It is a copy of the results for her. Continue work on exercise and diet.  Right shoulder pain-unclear etiology. Was very short-lived and has not persisted. I gave her reassurance. If he continues to recur or persist then please let me know and we can evaluate further. It may just be a strain on the tendons that resolved very quickly.  SI joint inflammation-recommend gentle stretches. Glad to get her in with our sports medicine doctor for injection if he gets a point where it's bothering her that much.  Diet review for nutrition referral? Yes ____  Not Indicated _X_   Patient Instructions (the written plan) was given to the patient.  Medicare Attestation I have personally reviewed: The patient's medical and social history Their use of alcohol, tobacco or illicit drugs Their current medications and supplements The patient's functional ability including ADLs,fall risks, home  safety risks, cognitive, and hearing and visual  impairment Diet and physical activities Evidence for depression or mood disorders  The patient's weight, height, BMI, and visual acuity have been recorded in the chart.  I have made referrals, counseling, and provided education to the patient based on review of the above and I have provided the patient with a written personalized care plan for preventive services.     Sandhya Denherder, MD   08/08/2013

## 2013-08-18 ENCOUNTER — Telehealth: Payer: Self-pay | Admitting: *Deleted

## 2013-08-18 NOTE — Telephone Encounter (Signed)
Pt was asking about the results of her urine. I only see the POCT UA. Please advise.  Meyer Cory, LPN

## 2013-08-18 NOTE — Telephone Encounter (Signed)
Tell her urine is normal

## 2013-08-22 NOTE — Telephone Encounter (Signed)
Pt informed.  Kevork Joyce, LPN  

## 2014-01-02 ENCOUNTER — Ambulatory Visit (INDEPENDENT_AMBULATORY_CARE_PROVIDER_SITE_OTHER): Payer: Medicare Other | Admitting: Family Medicine

## 2014-01-02 VITALS — BP 150/83 | HR 68 | Wt 133.0 lb

## 2014-01-02 DIAGNOSIS — I10 Essential (primary) hypertension: Secondary | ICD-10-CM

## 2014-01-02 DIAGNOSIS — L309 Dermatitis, unspecified: Secondary | ICD-10-CM

## 2014-01-02 DIAGNOSIS — R5381 Other malaise: Secondary | ICD-10-CM

## 2014-01-02 DIAGNOSIS — H269 Unspecified cataract: Secondary | ICD-10-CM | POA: Insufficient documentation

## 2014-01-02 DIAGNOSIS — M1990 Unspecified osteoarthritis, unspecified site: Secondary | ICD-10-CM

## 2014-01-02 DIAGNOSIS — H3581 Retinal edema: Secondary | ICD-10-CM | POA: Insufficient documentation

## 2014-01-02 DIAGNOSIS — R5383 Other fatigue: Secondary | ICD-10-CM

## 2014-01-02 DIAGNOSIS — L259 Unspecified contact dermatitis, unspecified cause: Secondary | ICD-10-CM

## 2014-01-02 LAB — POCT URINALYSIS DIPSTICK
Bilirubin, UA: NEGATIVE
Blood, UA: NEGATIVE
Glucose, UA: NEGATIVE
KETONES UA: NEGATIVE
LEUKOCYTES UA: NEGATIVE
NITRITE UA: NEGATIVE
PROTEIN UA: NEGATIVE
Spec Grav, UA: 1.02
Urobilinogen, UA: 0.2
pH, UA: 5.5

## 2014-01-02 NOTE — Patient Instructions (Signed)
He can try glucosamine/chondroitin for joint pain. Recommend check blood pressures in about 2 weeks after being on the increased dose of propranolol.

## 2014-01-02 NOTE — Progress Notes (Signed)
   Subjective:    Patient ID: Kathleen Foster, female    DOB: 1931/07/24, 80 y.o.   MRN: [id number]  HPI Hypertension- Pt denies chest pain, SOB, dizziness, or heart palpitations.  Taking meds as directed w/o problems.  Denies medication side effects.  Did bring in BP log.  Systolic blood pressures are ranging from 136- 170. Diastolics are primarily running in the 80s to 90s.  She is taking 10mg  of propranolol BID.    Has eye appt a month ago.  She has glaucoma, cataracs and now macular edema.   Her husband was moved to the memory unit at [facility] 4 weeks ago. This has been really stressful her.   Gets a dry itchy patch between her shoulder bladders. Comes and goes.  No known sugars. No worsening or alleviating factors. No rash.  Has noticed her urine has had some bubbles. She is concerned bc her husband had similar sxs and he was dx w/ proteinuria.  No dysruia or hematuria.   Wants to know what she can take for her arthritis that i smore natural.    Review of Systems     Objective:   Physical Exam  Constitutional: She is oriented to person, place, and time. She appears well-developed and well-nourished.  HENT:  Head: Normocephalic and atraumatic.  Cardiovascular: Normal rate, regular rhythm and normal heart sounds.   Pulmonary/Chest: Effort normal and breath sounds normal.  Neurological: She is alert and oriented to person, place, and time.  Skin: Skin is warm and dry.  She has multiple seborrheic keratoses on her forehead. Nothing appears worrisome. She has several cherry hemangiomas and seborrheic keratoses on her back as well.  Psychiatric: She has a normal mood and affect. Her behavior is normal.          Assessment & Plan:  HTN - half of BPs are not at goal.  Discussed options. She will try increasin her propranol to 20mg  in AM and 10mg  in PM.  Follow in 2 week at [facility] to keep an eye on the blood pressure. 85% her blood pressures are not quite at goal then we'll  need to make an adjustment to her regimen. Followup with me in 6 weeks.  Osteoarthritis-recommend a trial of glucosamine/chondroitin. Also recommend she see our sports med doc for her right shoulder.  Prior hx of rotator cuff issues.   Abnormal urine-will get a urinalysis today to evaluate for proteinuria and sediment. Negative for protein. Gave patient reassurance.  Dermatitis-she just has dry skin on her upper back. Gave her reassurance. No skin lesions on exam that are worrisome.

## 2014-01-03 ENCOUNTER — Encounter: Payer: Self-pay | Admitting: Family Medicine

## 2014-01-03 LAB — VITAMIN B12: Vitamin B-12: 380 pg/mL (ref 211–911)

## 2014-01-03 LAB — FOLATE: Folate: >20 ng/mL

## 2014-01-03 LAB — MAGNESIUM: Magnesium: 1.9 mg/dL (ref 1.5–2.5)

## 2014-01-03 LAB — FERRITIN: Ferritin: 107 ng/mL (ref 10–291)

## 2014-01-04 ENCOUNTER — Telehealth: Payer: Self-pay | Admitting: *Deleted

## 2014-01-04 NOTE — Telephone Encounter (Signed)
Pt called and wanted to know what strength, and type of Co-q 10 she should take. Please advise.Kathleen Foster

## 2014-01-05 LAB — VITAMIN B6: Vitamin B6: 17.2 ng/mL (ref 2.1–21.7)

## 2014-01-05 NOTE — Telephone Encounter (Signed)
Pt advised to get triple flex 50.Kathleen Foster

## 2014-01-05 NOTE — Telephone Encounter (Signed)
100 mg daily

## 2014-01-06 LAB — VITAMIN B1: Vitamin B1 (Thiamine): 26 nmol/L (ref 8–30)

## 2014-01-19 ENCOUNTER — Other Ambulatory Visit: Payer: Self-pay | Admitting: Family Medicine

## 2014-02-05 ENCOUNTER — Other Ambulatory Visit: Payer: Self-pay | Admitting: *Deleted

## 2014-02-05 DIAGNOSIS — L602 Onychogryphosis: Secondary | ICD-10-CM

## 2014-02-19 ENCOUNTER — Ambulatory Visit (INDEPENDENT_AMBULATORY_CARE_PROVIDER_SITE_OTHER): Payer: Medicare Other | Admitting: Family Medicine

## 2014-02-19 ENCOUNTER — Encounter: Payer: Self-pay | Admitting: Family Medicine

## 2014-02-19 ENCOUNTER — Ambulatory Visit (INDEPENDENT_AMBULATORY_CARE_PROVIDER_SITE_OTHER): Payer: Medicare Other

## 2014-02-19 VITALS — BP 159/89 | HR 70 | Wt 136.0 lb

## 2014-02-19 DIAGNOSIS — L659 Nonscarring hair loss, unspecified: Secondary | ICD-10-CM

## 2014-02-19 DIAGNOSIS — E785 Hyperlipidemia, unspecified: Secondary | ICD-10-CM

## 2014-02-19 DIAGNOSIS — I10 Essential (primary) hypertension: Secondary | ICD-10-CM

## 2014-02-19 DIAGNOSIS — M5090 Cervical disc disorder, unspecified, unspecified cervical region: Secondary | ICD-10-CM

## 2014-02-19 DIAGNOSIS — G4700 Insomnia, unspecified: Secondary | ICD-10-CM

## 2014-02-19 DIAGNOSIS — M542 Cervicalgia: Secondary | ICD-10-CM

## 2014-02-19 DIAGNOSIS — M47812 Spondylosis without myelopathy or radiculopathy, cervical region: Secondary | ICD-10-CM

## 2014-02-19 NOTE — Progress Notes (Signed)
   Subjective:    Patient ID: Kathleen Foster, female    DOB: 12/22/1930, 80 y.o.   MRN: [id number]  HPI Hypertension- Pt denies chest pain, SOB, dizziness, or heart palpitations.  Taking meds as directed w/o problems.  Denies medication side effects.  She is taking propranolol 10mg  TID Brought in home BPs.  Says when she takes 20mg  BID she feels too drowsy.  Seems to feel better on her regimen.    6 weeks of neck pain. She denies any known trauma or injury. She thought initially she had to strain her neck that has persisted. Her pain radiates to the base of the skull She is worried about a stroke.  Hard to turn to her left for the last 6-7 weeks Tried glucosamine but didn't like it. Finally stopped it.    Not sleeping well. Says can fall asleep but waking up frequently. She's not sure why she's not sleeping well. She denies any caffeine intake. He is taking several other supplements are not noted on her medication list. She specifically asked that we not put them on her medication list. She wants and if it would be okay to take chromium  She also complains of some thinning of her hair. She says it's not coming out in clumps but this seems to be more than usual. She's noticing at the most in the back of her scalp. She denies any rashes or itching. No new hair products or hair treatments. Review of Systems     Objective:   Physical Exam  Constitutional: She is oriented to person, place, and time. She appears well-developed and well-nourished.  HENT:  Head: Normocephalic and atraumatic.  Cardiovascular: Normal rate, regular rhythm and normal heart sounds.   Pulmonary/Chest: Effort normal and breath sounds normal.  Musculoskeletal:  Normal flexion-decreased extension. Significantly decreased rotation right and left. She's only able to rotate to about 30 each way. Also decreased side bending bilaterally. Range of motion is significantly worse on the left compared to the right.  Neurological: She is  alert and oriented to person, place, and time.  Skin: Skin is warm and dry.  Psychiatric: She has a normal mood and affect. Her behavior is normal.          Assessment & Plan:  HTN - Uncontrolled today but getting better B[place]. Unfortunately she did not tolerate 40 mg of propranolol in the past. We'll continue with the 30 mg a day for now. She had side effects with lisinopril, losartan and amlodipine. Continue to work on low-salt diet  Neck pain - will get xray for further evaluation. Suspect either disc disease or arthritis of the cervical spine. I think she would benefit greatly from physical therapy but will await the x-ray results first. Can use Tylenol as needed for pain relief. Hesitant to use NSAIDs in someone her age and might be at higher risk of bleeding. Did give her a handout on some stretches to do. Recommend using heat, been doing the stretches, and then applying ice..   Insomnia-recommend a trial of over-the-counter Benadryl is not helpful then can try valerian root capsules or tea.  Hair loss-unclear etiology at this point. We will check her thyroid. She has been under a lot of stress recently with having to put her husband in the memory unit at [facility].

## 2014-02-19 NOTE — Patient Instructions (Addendum)
Can try a benadryl about an hour before bedtime to help with sleep. Benadryl 25mg .   Can also try valerian root tea or capsules for sleep if the benadryl is not helping.  In about 2 weeks go to the lab to have bloodwork checked.

## 2014-12-10 ENCOUNTER — Encounter: Payer: Self-pay | Admitting: Physical Therapy

## 2014-12-10 ENCOUNTER — Ambulatory Visit (INDEPENDENT_AMBULATORY_CARE_PROVIDER_SITE_OTHER): Payer: Medicare Other | Admitting: Physical Therapy

## 2014-12-10 DIAGNOSIS — R52 Pain, unspecified: Secondary | ICD-10-CM

## 2014-12-10 DIAGNOSIS — R293 Abnormal posture: Secondary | ICD-10-CM | POA: Diagnosis not present

## 2014-12-10 DIAGNOSIS — R6889 Other general symptoms and signs: Secondary | ICD-10-CM

## 2014-12-10 NOTE — Therapy (Signed)
[place], [place], [address] Phone: [phone]   Fax:  [phone]  Physical Therapy Evaluation  Patient Details  Name: Kathleen Foster MRN: [id number] Date of Birth: 1930-09-30 Referring Provider:  Javier Glazier, MD  Encounter Date: 12/10/2014      PT End of Session - 12/10/14 1243    Visit Number 1   Number of Visits 6   Date for PT Re-Evaluation 01/21/15   PT Start Time 8828   PT Stop Time 1151   PT Time Calculation (min) 49 min      Past Medical History  Diagnosis Date  . Gallstones 1977  . Glaucoma   . Atrial fibrillation   . PVC's (premature ventricular contractions)   . Hemorrhoid   . GERD (gastroesophageal reflux disease)   . Glaucoma (increased eye pressure)   . Hypertension     Past Surgical History  Procedure Laterality Date  . Cyrosurgery for squamous cells  1974  . D & c for periods  1974, 1976, 1981  . Bladder surgery  1977    Chronic Pseudomebranous , urethritis, bladder neck, scar tissue  . Cholecystectomy    . Oophorectomy  1977    right ovary, exploratory for abd pain, ovary cyst  . Rt foot surgery Right     There were no vitals filed for this visit.  Visit Diagnosis:  Abnormal posture - Plan: PT plan of care cert/re-cert  Pain of multiple sites - Plan: PT plan of care cert/re-cert  Activity intolerance - Plan: PT plan of care cert/re-cert      Subjective Assessment - 12/10/14 1113    Subjective Reports that most of her pain is in her neck and top of her shoulders, she thinks it is from  having to lean over every thing .    Pertinent History Pt has a lot going on medically per her perception.     Diagnostic tests x-rays of the neck show degenerative changes   Patient Stated Goals patient wishes to learn preventitive things she can do, has limited availability as she is dependent on a driver and has glaucoma issues    Currently in Pain? No/denies  feels  some tension, not pain.  Will have foot and leg cramps             [facility] PT Assessment - 12/10/14 0001    Assessment   Medical Diagnosis neck and back pain   Onset Date 12/09/12   Next MD Visit not sure   Precautions   Precautions None   Balance Screen   Has the patient fallen in the past 6 months No   Has the patient had a decrease in activity level because of a fear of falling?  No   Is the patient reluctant to leave their home because of a fear of falling?  No   Home Environment   Living Enviornment Assisted living   Prior Function   Level of Independence --  I with bathing/dressing and walking   Leisure rides the stationary bike every day   Observation/Other Assessments   Focus on Therapeutic Outcomes (FOTO)  42% limited   Posture/Postural Control   Posture/Postural Control Postural limitations  REEDCO score 60/100   Postural Limitations Rounded Shoulders;Forward head;Increased thoracic kyphosis;Increased lumbar lordosis   ROM / Strength   AROM / PROM / Strength AROM;Strength   AROM   Overall AROM  --  bilat UE's WNL   Overall AROM Comments lumbar  ROM not assessed as pt thinks she may have osteoporosis   AROM Assessment Site Cervical;Shoulder   Cervical Flexion WNl   Cervical Extension WNL   Cervical - Right Rotation 57   Cervical - Left Rotation 52   Strength   Overall Strength --  bilat UE/LE's WNL,                            PT Education - 12/10/14 1140    Education provided Yes   Education Details HEP postural realignment   Person(s) Educated Patient   Methods Explanation;Handout   Comprehension Verbalized understanding             PT Long Term Goals - 12/10/14 1142    PT LONG TERM GOAL #1   Title I with postural realignment excercise   Time 6   Period Weeks   Status New   PT LONG TERM GOAL #2   Title improve REEDCO posture score =/> 80/100   Time 6   Period Weeks   Status New   PT LONG TERM GOAL #3   Title improve  FOTO =/< 38% limited (CJ level)   PT LONG TERM GOAL #4   Title report pain decrease in neck and back =/> 50%   Time 6   Period Weeks   Status New   PT LONG TERM GOAL #5   Title increase cervical rotation =/> 60 degrees bilat               Plan - 12/10/14 1144    Clinical Impression Statement Pt presents with neck and low back/SIJ pain.  She has significant postural changes along with these.  Overall her strength is good. She also has some neck limitations   Pt will benefit from skilled therapeutic intervention in order to improve on the following deficits Decreased endurance;Postural dysfunction;Pain;Decreased range of motion   Rehab Potential Good   PT Frequency 1x / week   PT Duration 6 weeks   PT Treatment/Interventions Moist Heat;Patient/family education;Therapeutic activities;Passive range of motion;Therapeutic exercise;Electrical Stimulation;Cryotherapy;Manual techniques;Ultrasound   PT Next Visit Plan progress HEP to bands   Consulted and Agree with Plan of Care Patient         Problem List Patient Active Problem List   Diagnosis Date Noted  . Cataract 01/02/2014  . Macular edema 01/02/2014  . Closed angle glaucoma 03/07/2013  . Essential hypertension, benign 03/07/2013  . History of atrial fibrillation 06/09/2012  . PVC (premature ventricular contraction) 06/09/2012  . Migraine headache without aura 06/09/2012   Called facility and requested a Bone Density Scan  Jeral Pinch, PT 12/10/2014, 12:48 PM  [facility] [place], [place], [address] Phone: [phone]   Fax:  [phone]

## 2014-12-10 NOTE — Patient Instructions (Signed)
  _  Decompression Exercise: Basic   Lie on back on firm surface, knees bent, feet flat, arms turned up, out to sides, backs of hands down. Time _5__ minutes. Surface: floor   Copyright  VHI. All rights reserved.  Shoulder Press   Press both shoulders down. Hold _3__ seconds. Repeat _10__ times. Press one shoulder down. Hold ___ seconds Repeat ___ times. Do other shoulder. If unable to press one or both shoulders, lie in position a few sessions until you can. Surface: floor   Copyright  VHI. All rights reserved.  Head Press With Gibbs chin SLIGHTLY toward chest, keep mouth closed. Feel weight on back of head. Increase weight by pressing head down. Hold _3_ seconds. Relax. Repeat _10__ times. Surface: floor   Copyright  VHI. All rights reserved.  Leg Lengthener: Full   Straighten one leg. Pull toes AND forefoot toward knee, extend heel. Lengthen leg by pulling pelvis away from ribs. Hold _3__ seconds. Relax. Repeat 1 time. Re-bend knee. Do other leg. Each leg _10__ times. Surface: floor   Leg Press: Single   Straighten one leg down to floor. Bring toes AND forefoot toward knee, extend heel. Press leg down. DO NOT BEND KNEE. Hold _3__ seconds. Relax leg. Repeat exercise 1 time. Relax leg. Re-bend knee. Repeat with other leg. Each leg _10__ times.  Jule Ser [facility] [phone]

## 2014-12-24 ENCOUNTER — Ambulatory Visit (INDEPENDENT_AMBULATORY_CARE_PROVIDER_SITE_OTHER): Payer: Medicare Other | Admitting: Physical Therapy

## 2014-12-24 ENCOUNTER — Encounter: Payer: Self-pay | Admitting: Physical Therapy

## 2014-12-24 DIAGNOSIS — R293 Abnormal posture: Secondary | ICD-10-CM | POA: Diagnosis not present

## 2014-12-24 DIAGNOSIS — R6889 Other general symptoms and signs: Secondary | ICD-10-CM

## 2014-12-24 DIAGNOSIS — R52 Pain, unspecified: Secondary | ICD-10-CM

## 2014-12-24 NOTE — Therapy (Signed)
[place] [address] [place], [place], [address] Phone: [phone]   Fax:  [phone]  Physical Therapy Treatment  Patient Details  Name: Kathleen Foster MRN: [id number] Date of Birth: Mar 31, 1931 Referring Provider:  Javier Glazier, MD  Encounter Date: 12/24/2014      PT End of Session - 12/24/14 0946    Visit Number 2   Number of Visits 6   Date for PT Re-Evaluation 01/21/15   PT Start Time 0936   PT Stop Time 3546   PT Time Calculation (min) 38 min   Activity Tolerance Patient tolerated treatment well      Past Medical History  Diagnosis Date  . Gallstones 1977  . Glaucoma   . Atrial fibrillation   . PVC's (premature ventricular contractions)   . Hemorrhoid   . GERD (gastroesophageal reflux disease)   . Glaucoma (increased eye pressure)   . Hypertension     Past Surgical History  Procedure Laterality Date  . Cyrosurgery for squamous cells  1974  . D & c for periods  1974, 1976, 1981  . Bladder surgery  1977    Chronic Pseudomebranous , urethritis, bladder neck, scar tissue  . Cholecystectomy    . Oophorectomy  1977    right ovary, exploratory for abd pain, ovary cyst  . Rt foot surgery Right     There were no vitals filed for this visit.  Visit Diagnosis:  Abnormal posture  Pain of multiple sites  Activity intolerance      Subjective Assessment - 12/24/14 0937    Subjective Today is a good day, not having any pain yet.  Pain continues to be intermittent. Will have pain still in Rt SIJ area at times. Concerned about if she is doing her HEP correctly.   Currently in Pain? No/denies                       [facility] Adult PT Treatment/Exercise - 12/24/14 0001    Exercises   Exercises Lumbar;Neck   Neck Exercises: Supine   Cervical Isometrics 5 secs  head and shoulder presses per HEP   Shoulder ABduction 20 reps  with yellow theraband   Lumbar Exercises: Supine   Ab Set 20  reps  with PPT   Bridge 20 reps   Other Supine Lumbar Exercises 5 reps, leg lentheners and presses                     PT Long Term Goals - 12/24/14 0947    PT LONG TERM GOAL #1   Title I with postural realignment excercise   Status On-going   PT LONG TERM GOAL #2   Title improve REEDCO posture score =/> 80/100   Status On-going   PT LONG TERM GOAL #3   Title improve FOTO =/< 38% limited (CJ level)   Status On-going   PT LONG TERM GOAL #4   Title report pain decrease in neck and back =/> 50%   Status On-going   PT LONG TERM GOAL #5   Title increase cervical rotation =/> 60 degrees bilat   Status On-going               Plan - 12/24/14 1006    Clinical Impression Statement Pt tolerates ther ex well, she has lots of questions about the exercise and really wishes to do a good job with them.  Only her second visit, no goals met yet.  Pt will benefit from skilled therapeutic intervention in order to improve on the following deficits Decreased endurance;Postural dysfunction;Pain;Decreased range of motion   Rehab Potential Good   PT Frequency 1x / week   PT Duration 6 weeks   PT Treatment/Interventions Moist Heat;Patient/family education;Therapeutic activities;Passive range of motion;Therapeutic exercise;Electrical Stimulation;Cryotherapy;Manual techniques;Ultrasound   PT Next Visit Plan progress HEP to bands, did not do this visit as she had a lot of questions about old HEP   Consulted and Agree with Plan of Care Patient        Problem List Patient Active Problem List   Diagnosis Date Noted  . Cataract 01/02/2014  . Macular edema 01/02/2014  . Closed angle glaucoma 03/07/2013  . Essential hypertension, benign 03/07/2013  . History of atrial fibrillation 06/09/2012  . PVC (premature ventricular contraction) 06/09/2012  . Migraine headache without aura 06/09/2012    Jeral Pinch, PT 12/24/2014, 10:09 AM  [facility] [place], [place], [address] Phone: [phone]   Fax:  [phone]

## 2015-01-01 ENCOUNTER — Ambulatory Visit (INDEPENDENT_AMBULATORY_CARE_PROVIDER_SITE_OTHER): Payer: Medicare Other | Admitting: Physical Therapy

## 2015-01-01 DIAGNOSIS — R6889 Other general symptoms and signs: Secondary | ICD-10-CM | POA: Diagnosis not present

## 2015-01-01 DIAGNOSIS — R293 Abnormal posture: Secondary | ICD-10-CM | POA: Diagnosis not present

## 2015-01-01 NOTE — Therapy (Signed)
[place] [facility] [place] [facility] [place], [place], [address] Phone: [phone]   Fax:  [phone]  Physical Therapy Treatment  Patient Details  Name: Kathleen Foster MRN: [id number] Date of Birth: August 10, 1931 Referring Provider:  Javier Glazier, MD  Encounter Date: 01/01/2015      PT End of Session - 01/01/15 0928    Visit Number 3   Number of Visits 6   Date for PT Re-Evaluation 01/21/15   PT Start Time 0923   PT Stop Time 1010   PT Time Calculation (min) 47 min   Activity Tolerance Patient tolerated treatment well;No increased pain      Past Medical History  Diagnosis Date  . Gallstones 1977  . Glaucoma   . Atrial fibrillation   . PVC's (premature ventricular contractions)   . Hemorrhoid   . GERD (gastroesophageal reflux disease)   . Glaucoma (increased eye pressure)   . Hypertension     Past Surgical History  Procedure Laterality Date  . Cyrosurgery for squamous cells  1974  . D & c for periods  1974, 1976, 1981  . Bladder surgery  1977    Chronic Pseudomebranous , urethritis, bladder neck, scar tissue  . Cholecystectomy    . Oophorectomy  1977    right ovary, exploratory for abd pain, ovary cyst  . Rt foot surgery Right     There were no vitals filed for this visit.  Visit Diagnosis:  Abnormal posture  Activity intolerance      Subjective Assessment - 01/01/15 0928    Subjective Pt doing well.  Pt reports she has questions with her form on existing HEP.    Currently in Pain? No/denies           [facility] Adult PT Treatment/Exercise - 01/01/15 0001    Exercises   Exercises Neck;Lumbar: performed in hooklying without pillow.   Neck Exercises: Supine   Cervical Isometrics 3 secs;10 reps  headpresses. Tactile cues for form   Upper Extremity D2 10 reps;Theraband: yellow   shoulder diagonals, each arm    Other Supine Exercise chin tucks x 10, shoulder presses, 3 sec hold x 10    Other Supine  Exercise horizontal abd with yellow band x 10, with red band x 8 reps.  Bilat shoulder ER with yellow band x 10 reps    Lumbar Exercises: Aerobic   UBE (Upper Arm Bike) L1: 1.5 min each way seated   Lumbar Exercises: Supine   Other Supine Lumbar Exercises Leg lengtheners and leg press each leg x 3 sec hold x 10, R/L                PT Education - 01/01/15 1015    Education provided Yes   Education Details HEP and issued yellow band    Person(s) Educated Patient   Methods Handout;Explanation   Comprehension Verbalized understanding;Returned demonstration             PT Long Term Goals - 12/24/14 0947    PT LONG TERM GOAL #1   Title I with postural realignment excercise   Status On-going   PT LONG TERM GOAL #2   Title improve REEDCO posture score =/> 80/100   Status On-going   PT LONG TERM GOAL #3   Title improve FOTO =/< 38% limited (CJ level)   Status On-going   PT LONG TERM GOAL #4   Title report pain decrease in neck and back =/> 50%   Status On-going  PT LONG TERM GOAL #5   Title increase cervical rotation =/> 60 degrees bilat   Status On-going               Plan - 01/01/15 1010    Clinical Impression Statement Pt again presents with forward head posture; no report of pain today.  Pt tolerated all exercises without any neck/back pain.  Pt with numerous detailed questions regarding form of exercises; required VC/tactile cues for technique.  Progressing towards goals.    Pt will benefit from skilled therapeutic intervention in order to improve on the following deficits Decreased endurance;Postural dysfunction;Pain;Decreased range of motion   Rehab Potential Good   PT Frequency 1x / week   PT Duration 6 weeks   PT Treatment/Interventions Moist Heat;Patient/family education;Therapeutic activities;Passive range of motion;Therapeutic exercise;Electrical Stimulation;Cryotherapy;Manual techniques;Ultrasound   PT Next Visit Plan Continue progressive spine  stabilization exercises/postural education.  Try gentle thoracic extension stretch.    Consulted and Agree with Plan of Care Patient        Problem List Patient Active Problem List   Diagnosis Date Noted  . Cataract 01/02/2014  . Macular edema 01/02/2014  . Closed angle glaucoma 03/07/2013  . Essential hypertension, benign 03/07/2013  . History of atrial fibrillation 06/09/2012  . PVC (premature ventricular contraction) 06/09/2012  . Migraine headache without aura 06/09/2012   Kerin Perna, PTA 01/01/2015 11:33 AM  [place], [place], [address] Phone: [phone]   Fax:  [phone]

## 2015-01-01 NOTE — Patient Instructions (Signed)
Side Pull: Double Arm   On back, knees bent, feet flat. Arms perpendicular to body, shoulder level, elbows straight but relaxed. Pull arms out to sides, elbows straight. Resistance band comes across collarbones, hands toward floor. Hold momentarily. Slowly return to starting position. Repeat _10__ times. Band color __yellow___   Sash   On back, knees bent, feet flat, left hand on left hip, right hand above left. Pull right arm DIAGONALLY (hip to shoulder) across chest. Bring right arm along head toward floor. Hold momentarily. Slowly return to starting position. (Thumb is up like hitch hiking) Repeat _10__ times. Do with left arm. Band color ___yellow___   Shoulder Rotation: Double Arm   On back, knees bent, feet flat, elbows tucked at sides, bent 90, hands palms up. Pull hands apart and down toward floor, keeping elbows near sides. Hold momentarily. Slowly return to starting position. Repeat _10__ times. Band color ___yellow___   Suboccipital Stretch (Supine)  Copyright  VHI. All rights reserved.  Suboccipital Stretch (Supine)   With small towel roll at base of skull and upper neck. Gently tuck chin until stretch is felt at base of skull and upper neck. Hold __3__ seconds. Relax. Repeat _5-10___ times per set. Do _1___ sets per session. Do __1__ sessions per day.  http://orth.exer.us/984   Copyright  VHI. All rights reserved.

## 2015-01-16 ENCOUNTER — Ambulatory Visit (INDEPENDENT_AMBULATORY_CARE_PROVIDER_SITE_OTHER): Payer: Medicare Other | Admitting: Physical Therapy

## 2015-01-16 ENCOUNTER — Encounter: Payer: Self-pay | Admitting: Physical Therapy

## 2015-01-16 DIAGNOSIS — R52 Pain, unspecified: Secondary | ICD-10-CM

## 2015-01-16 DIAGNOSIS — R293 Abnormal posture: Secondary | ICD-10-CM | POA: Diagnosis not present

## 2015-01-16 NOTE — Therapy (Signed)
[facility] [employer] [place], [place], [address] Phone: [phone]   Fax:  [phone]  Physical Therapy Treatment  Patient Details  Name: Kathleen Foster MRN: [id number] Date of Birth: [birth year] Referring Provider:  Javier Glazier, MD  Encounter Date: 01/16/2015      PT End of Session - 01/16/15 1112    Visit Number 4   Number of Visits 6   Date for PT Re-Evaluation 01/21/15   PT Start Time 1112   PT Stop Time 1159   PT Time Calculation (min) 47 min      Past Medical History  Diagnosis Date  . Gallstones 1977  . Glaucoma   . Atrial fibrillation   . PVC's (premature ventricular contractions)   . Hemorrhoid   . GERD (gastroesophageal reflux disease)   . Glaucoma (increased eye pressure)   . Hypertension     Past Surgical History  Procedure Laterality Date  . Cyrosurgery for squamous cells  1974  . D & c for periods  1974, 1976, 1981  . Bladder surgery  1977    Chronic Pseudomebranous , urethritis, bladder neck, scar tissue  . Cholecystectomy    . Oophorectomy  1977    right ovary, exploratory for abd pain, ovary cyst  . Rt foot surgery Right     There were no vitals filed for this visit.  Visit Diagnosis:  Abnormal posture  Pain of multiple sites      Subjective Assessment - 01/16/15 1114    Subjective Pt reports that her left hip/SIJ area has started to hurt a couple of weeks ago. Pt reports that today has to be her last appointment as her eyes are getting worse and her BP is going up and transportation has become very difficult     Pertinent History Pt has a lot going on medically per her perception.     Diagnostic tests x-rays of the neck show degenerative changes   Patient Stated Goals patient wishes to learn preventitive things she can do, has limited availability as she is dependent on a driver and has glaucoma issues    Currently in Pain? No/denies  not sure when she has pain however  will notice it some with bending forward around the apartment.             [employer] PT Assessment - 01/16/15 0001    Assessment   Medical Diagnosis neck and back pain   Onset Date 12/09/12   Observation/Other Assessments   Focus on Therapeutic Outcomes (FOTO)  38% limited   Posture/Postural Control   Posture/Postural Control Postural limitations  REEDCO score 86/100   AROM   Cervical - Right Rotation 53   Cervical - Left Rotation 50                     [facility] Adult PT Treatment/Exercise - 01/16/15 0001    Neck Exercises: Supine   Shoulder ABduction 10 reps;Both  horizontal with yellow band   Upper Extremity D1 10 reps;Theraband   Theraband Level (UE D1) Level 1 (Yellow)   Other Supine Exercise decompression serries, 10 reps of all with 5 sec holds, vc for form   Other Supine Exercise 10 thoracic lifts   Lumbar Exercises: Supine   Bridge 20 reps                PT Education - 01/16/15 1133    Education provided Yes   Education Details HEP   Person(s)  Educated Patient   Methods Handout   Comprehension Verbal cues required;Returned demonstration             PT Long Term Goals - 01/16/15 1120    PT LONG TERM GOAL #1   Title I with postural realignment excercise   Status Achieved   PT LONG TERM GOAL #2   Title improve REEDCO posture score =/> 80/100  scored 86/100   Status Achieved   PT LONG TERM GOAL #3   Title improve FOTO =/< 38% limited (CJ level)  scored 37% limited   Status Not Met   PT LONG TERM GOAL #4   Title report pain decrease in neck and back =/> 50%  pt reports pain has never really been an issue, she does feel like    Status Not Met   PT LONG TERM GOAL #5   Title increase cervical rotation =/> 60 degrees bilat  see note for measurements    Status Not Met               Problem List Patient Active Problem List   Diagnosis Date Noted  . Cataract 01/02/2014  . Macular edema 01/02/2014  . Closed angle glaucoma  03/07/2013  . Essential hypertension, benign 03/07/2013  . History of atrial fibrillation 06/09/2012  . PVC (premature ventricular contraction) 06/09/2012  . Migraine headache without aura 06/09/2012    Jeral Pinch, PT 01/16/2015, 2:18 PM  [facility] [place], [place], [address] Phone: [phone]   Fax:  [phone]   PHYSICAL THERAPY DISCHARGE SUMMARY  Visits from Start of Care: 4  Current functional level related to goals / functional outcomes: See above   Remaining deficits: As above   Education / Equipment: HEP Plan: Patient agrees to discharge.  Patient goals were partially met. Patient is being discharged due to the patient's request.  ?????   Jeral Pinch, PT 01/16/15 1419

## 2015-01-16 NOTE — Patient Instructions (Signed)
Bridging    [place] [phone]   Slowly raise buttocks from floor, keeping stomach tight. Repeat _10-20___ times per set. Do _1___ sets per session. Do __1__ sessions per day.  http://orth.exer.us/1096   Copyright  VHI. All rights reserved.  Thoracic Lift   Press shoulders down. Then lift mid-thoracic spine (area between the shoulder blades). Lift the breastbone slightly. Hold _3-5__ seconds. Relax. Repeat _10__ times. Do every other day.   Copyright  VHI. All rights reserved.

## 2016-03-01 ENCOUNTER — Emergency Department (HOSPITAL_COMMUNITY)
Admission: EM | Admit: 2016-03-01 | Discharge: 2016-03-02 | Disposition: A | Discharge status: Home / Self Care | Payer: Medicare Other | Attending: Emergency Medicine | Admitting: Emergency Medicine

## 2016-03-01 ENCOUNTER — Encounter (HOSPITAL_COMMUNITY): Payer: Self-pay | Admitting: Family Medicine

## 2016-03-01 DIAGNOSIS — R040 Epistaxis: Secondary | ICD-10-CM | POA: Diagnosis not present

## 2016-03-01 DIAGNOSIS — I10 Essential (primary) hypertension: Secondary | ICD-10-CM | POA: Diagnosis not present

## 2016-03-01 DIAGNOSIS — Z79899 Other long term (current) drug therapy: Secondary | ICD-10-CM | POA: Diagnosis not present

## 2016-03-01 DIAGNOSIS — H409 Unspecified glaucoma: Secondary | ICD-10-CM | POA: Diagnosis not present

## 2016-03-01 DIAGNOSIS — I4891 Unspecified atrial fibrillation: Secondary | ICD-10-CM | POA: Diagnosis not present

## 2016-03-01 NOTE — ED Notes (Signed)
Squired normal saline in each nostril as directed by Dr. Curlene Labrum. No nosebleed occurred. Pt tolerated it well. Also, notified Ruth that patient is going to be discharged and a driver is coming from facility to transport patient back to facility.

## 2016-03-01 NOTE — ED Notes (Signed)
Pt from [facility] about 2100 this evening with nose bleed.  BP was 200/100s per EMS with BP meds (Propanolol in am and pm).  Pt a & o x 4, NAD.  Bleeding controlled at this time.  Pt has washcloth to nose and pinching it.  Denies pain at this time.

## 2016-03-01 NOTE — ED Provider Notes (Signed)
CSN: [id number]     Arrival date & time 03/01/16  2227 History  By signing my name below, I, Altamease Oiler, attest that this documentation has been prepared under the direction and in the presence of Saul Dorsi, MD. Electronically Signed: Altamease Oiler, ED Scribe. 03/01/2016. 11:44 PM   Chief Complaint  Patient presents with  . Epistaxis    about 1 hour.   Patient is a 82 y.o. female presenting with nosebleeds. The history is provided by the patient. No language interpreter was used.  Epistaxis Location:  Unable to specify Severity:  Moderate Duration:  2 hours Timing:  Sporadic Progression:  Resolved Chronicity:  Recurrent Context: hypertension   Context: not anticoagulants, not aspirin use, not foreign body, not home oxygen and not trauma   Relieved by:  Nothing Worsened by:  Nothing tried Ineffective treatments:  None tried Associated symptoms: no blood in oropharynx, no congestion, no fever, no sneezing and no sore throat   Risk factors: no alcohol use    [facility], Kathleen Foster is a 82 y.o. female with PMHx of HTN and a-fib who presents to the Emergency Department complaining of recurrent epistaxis with onset approximately 2 hours ago. She states that she had a "profuse" nosebleed 6 days ago that improved with Afrin and until tonight she had had no further bleeding. Tonight she sat down to eat and blood started to fall into her bowl. The bleeding resolved PTA. She does not take a daily aspirin and denies any other blood thinners.   Past Medical History  Diagnosis Date  . Gallstones 1977  . Glaucoma   . Atrial fibrillation ([place])   . PVC's (premature ventricular contractions)   . Hemorrhoid   . GERD (gastroesophageal reflux disease)   . Glaucoma (increased eye pressure)   . Hypertension    Past Surgical History  Procedure Laterality Date  . Cyrosurgery for squamous cells  1974  . D & c for periods  1974, 1976, 1981  . Bladder surgery   1977    Chronic Pseudomebranous , urethritis, bladder neck, scar tissue  . Cholecystectomy    . Oophorectomy  1977    right ovary, exploratory for abd pain, ovary cyst  . Rt foot surgery Right    Family History  Problem Relation Age of Onset  . Heart disease Mother   . Colon cancer Sister 46  . Stroke Father   . Diabetes      Uncle   Social History  Substance Use Topics  . Smoking status: Never Smoker   . Smokeless tobacco: None  . Alcohol Use: No   OB History    No data available     Review of Systems  Constitutional: Negative for fever.  HENT: Positive for nosebleeds. Negative for congestion, sneezing and sore throat.   All other systems reviewed and are negative.  Allergies  Amlodipine; Erythromycin; Lisinopril; Losartan; and Hydrochlorothiazide  Home Medications   Prior to Admission medications   Medication Sig Start Date End Date Taking? Authorizing Provider  ascorbic acid (VITAMIN C) 500 MG tablet Take 500 mg by mouth as needed.    Yes Historical Provider, MD  Cholecalciferol (VITAMIN D3) 5000 units TABS Take 1 tablet by mouth daily. 01/28/16  Yes Historical Provider, MD  DOCOSAHEXAENOIC ACID PO Take 1 capsule by mouth daily.   Yes Historical Provider, MD  latanoprost (XALATAN) 0.005 % ophthalmic solution Place 1 drop into both eyes daily. 02/10/16  Yes Historical Provider, MD  Magnesium Oxide, Antacid, 500 MG CAPS Take 250 mg by mouth.   Yes Historical Provider, MD  propranolol (INDERAL) 20 MG tablet TAKE ONE TABLET TWICE DAILY Patient taking differently: TAKE ONE HALF ( 0.5)  TABLET TWICE DAILY 01/19/14  Yes Hali Marry, MD  Riboflavin (B-2) 100 MG TABS Take 50 mg by mouth daily.   Yes Historical Provider, MD  thiamine (VITAMIN B-1) 100 MG tablet Take 50 mg by mouth daily.   Yes Historical Provider, MD  timolol (TIMOPTIC) 0.5 % ophthalmic solution Place 1 drop into both eyes daily. 02/10/16  Yes Historical Provider, MD  triamcinolone ointment (KENALOG) 0.1 %   01/22/16  Yes Historical Provider, MD   BP 157/86 mmHg  Pulse 73  Temp(Src) 97.7 F (36.5 C) (Oral)  Resp 18  Ht 5\' 5"  (1.651 m)  Wt 136 lb (61.689 kg)  BMI 22.63 kg/m2  SpO2 97% Physical Exam  Constitutional: She is oriented to person, place, and time. She appears well-developed and well-nourished.  HENT:  Head: Normocephalic.  Mouth/Throat: Oropharynx is clear and moist.  Moist mucous membranes No exudate No anterior or posterior bleed No bleeding down the back of the throat  Eyes: EOM are normal. Pupils are equal, round, and reactive to light.  Neck: Normal range of motion.  Trachea midline  Cardiovascular: Normal rate, regular rhythm and intact distal pulses.   Pulmonary/Chest: Effort normal and breath sounds normal. No stridor. She has no wheezes. She has no rales.  Abdominal: Soft. She exhibits no mass. There is no tenderness. There is no rebound and no guarding.  Musculoskeletal: Normal range of motion.  Neurological: She is alert and oriented to person, place, and time.  Skin: Skin is warm and dry.  Psychiatric: She has a normal mood and affect. Her behavior is normal.  Nursing note and vitals reviewed.   ED Course  Procedures (including critical care time) DIAGNOSTIC STUDIES: Oxygen Saturation is 97% on RA,  normal by my interpretation.    COORDINATION OF CARE: 11:40 PM Discussed treatment plan which includes discharge to use saline nasal spray with pt at bedside and pt agreed to plan.  Labs Review Labs Reviewed - No data to display   Imaging Review No results found.   EKG Interpretation None      MDM   Final diagnoses:  None    Filed Vitals:   03/01/16 2233  BP: 157/86  Pulse: 73  Temp: 97.7 F (36.5 C)  Resp: 18    Saline sprayed in each nostril. No bleeding in the ED.  Continue BID spray spray in each nostril consider vaporizer in your room.  Close follow up with your PMD.  Strict return precautions given  I personally performed the  services described in this documentation, which was scribed in my presence. The recorded information has been reviewed and is accurate.      Veatrice Kells, MD 03/02/16 [phone]

## 2016-03-01 NOTE — ED Notes (Signed)
Bed: [id number] Expected date:  Expected time:  Means of arrival:  Comments: 82 yo F  Epistaxis

## 2016-03-01 NOTE — ED Notes (Signed)
Daughter, Jesse Fall, [place] and [place]

## 2016-03-01 NOTE — Discharge Instructions (Signed)

## 2016-03-03 ENCOUNTER — Emergency Department (HOSPITAL_BASED_OUTPATIENT_CLINIC_OR_DEPARTMENT_OTHER)
Admission: EM | Admit: 2016-03-03 | Discharge: 2016-03-03 | Disposition: A | Discharge status: Home / Self Care | Payer: Medicare Other | Attending: Emergency Medicine | Admitting: Emergency Medicine

## 2016-03-03 DIAGNOSIS — Z79899 Other long term (current) drug therapy: Secondary | ICD-10-CM | POA: Diagnosis not present

## 2016-03-03 DIAGNOSIS — R040 Epistaxis: Secondary | ICD-10-CM | POA: Diagnosis not present

## 2016-03-03 DIAGNOSIS — I10 Essential (primary) hypertension: Secondary | ICD-10-CM | POA: Insufficient documentation

## 2016-03-03 DIAGNOSIS — I4891 Unspecified atrial fibrillation: Secondary | ICD-10-CM | POA: Insufficient documentation

## 2016-03-03 MED ORDER — SILVER NITRATE-POT NITRATE 75-25 % EX MISC
CUTANEOUS | Status: AC
  Administered 2016-03-03: 1 via TOPICAL
  Filled 2016-03-03: qty 1

## 2016-03-03 MED ORDER — SILVER NITRATE-POT NITRATE 75-25 % EX MISC
1.0000 | Freq: Once | CUTANEOUS | Status: AC
  Administered 2016-03-03: 1 via TOPICAL

## 2016-03-03 NOTE — ED Provider Notes (Signed)
CSN: [id number]     Arrival date & time 03/03/16  0108 History   First MD Initiated Contact with Patient 03/03/16 0139     Chief Complaint  Patient presents with  . Epistaxis     (Consider location/radiation/quality/duration/timing/severity/associated sxs/prior Treatment) HPI  This is a 82 year old female who presents with a nosebleed. Patient reports that she had a "bad" nosebleed on June 19. It resolved with Afrin at her living facility. She re-bled last night and was seen at [facility]. Upon evaluation, bleeding had stopped. She was advised to use nasal saline daily as well as a humidifier. Patient reports that tonight she was brushing her teeth and noted dripping into the sink. She states that she is recently had some postnasal drip and has been using Q-tips to clean her nose. She denies any aspirin or anticoagulant use. She has not followed up with ENT.  Past Medical History  Diagnosis Date  . Gallstones 1977  . Glaucoma   . Atrial fibrillation ([place])   . PVC's (premature ventricular contractions)   . Hemorrhoid   . GERD (gastroesophageal reflux disease)   . Glaucoma (increased eye pressure)   . Hypertension    Past Surgical History  Procedure Laterality Date  . Cyrosurgery for squamous cells  1974  . D & c for periods  1974, 1976, 1981  . Bladder surgery  1977    Chronic Pseudomebranous , urethritis, bladder neck, scar tissue  . Cholecystectomy    . Oophorectomy  1977    right ovary, exploratory for abd pain, ovary cyst  . Rt foot surgery Right    Family History  Problem Relation Age of Onset  . Heart disease Mother   . Colon cancer Sister 34  . Stroke Father   . Diabetes      Uncle   Social History  Substance Use Topics  . Smoking status: Never Smoker   . Smokeless tobacco: Not on file  . Alcohol Use: No   OB History    No data available     Review of Systems  HENT: Positive for nosebleeds.   Skin: Negative for wound.  All other systems  reviewed and are negative.     Allergies  Amlodipine; Erythromycin; Lisinopril; Losartan; and Hydrochlorothiazide  Home Medications   Prior to Admission medications   Medication Sig Start Date End Date Taking? Authorizing Provider  ascorbic acid (VITAMIN C) 500 MG tablet Take 500 mg by mouth as needed.     Historical Provider, MD  Cholecalciferol (VITAMIN D3) 5000 units TABS Take 1 tablet by mouth daily. 01/28/16   Historical Provider, MD  DOCOSAHEXAENOIC ACID PO Take 1 capsule by mouth daily.    Historical Provider, MD  latanoprost (XALATAN) 0.005 % ophthalmic solution Place 1 drop into both eyes daily. 02/10/16   Historical Provider, MD  Magnesium Oxide, Antacid, 500 MG CAPS Take 250 mg by mouth.    Historical Provider, MD  propranolol (INDERAL) 20 MG tablet TAKE ONE TABLET TWICE DAILY Patient taking differently: TAKE ONE HALF ( 0.5)  TABLET TWICE DAILY 01/19/14   Hali Marry, MD  Riboflavin (B-2) 100 MG TABS Take 50 mg by mouth daily.    Historical Provider, MD  thiamine (VITAMIN B-1) 100 MG tablet Take 50 mg by mouth daily.    Historical Provider, MD  timolol (TIMOPTIC) 0.5 % ophthalmic solution Place 1 drop into both eyes daily. 02/10/16   Historical Provider, MD  triamcinolone ointment (KENALOG) 0.1 %  01/22/16  Historical Provider, MD   BP 158/78 mmHg  Pulse 59  Temp(Src) 97.7 F (36.5 C) (Oral)  Resp 18  SpO2 96% Physical Exam  Constitutional: She is oriented to person, place, and time. She appears well-developed and well-nourished. No distress.  HENT:  Head: Normocephalic and atraumatic.  No active bleeding noted from the bilateral nares, there is friability noted over the right nasal septum, no clots noted  Cardiovascular: Normal rate and regular rhythm.   Pulmonary/Chest: Effort normal. No respiratory distress.  Neurological: She is alert and oriented to person, place, and time.  Skin: Skin is warm and dry.  Psychiatric: She has a normal mood and affect.  Nursing  note and vitals reviewed.   ED Course  .Epistaxis Management Date/Time: 03/03/2016 2:37 AM Performed by: Merryl Hacker Authorized by: Merryl Hacker Consent: Verbal consent obtained. Risks and benefits: risks, benefits and alternatives were discussed Patient sedated: no Treatment site: right anterior Repair method: silver nitrate Post-procedure assessment: bleeding stopped Treatment complexity: simple Recurrence: recurrence of recent bleed Patient tolerance: Patient tolerated the procedure well with no immediate complications   (including critical care time) Labs Review Labs Reviewed - No data to display  Imaging Review No results found. I have personally reviewed and evaluated these images and lab results as part of my medical decision-making.   EKG Interpretation None      MDM   Final diagnoses:  Epistaxis    Patient presents for second time with recurrent stackers. She is nontoxic. No active bleeding. She does have some friability over the nasal septum. Given recurrence of bleeding, this was cauterized with silver nitrate. I have advised the patient not to use Q-tips in her nose. Continue saline and humidifier at home. ENT follow-up was provided.  After history, exam, and medical workup I feel the patient has been appropriately medically screened and is safe for discharge home. Pertinent diagnoses were discussed with the patient. Patient was given return precautions.     Merryl Hacker, MD 03/03/16 [phone]

## 2016-03-03 NOTE — ED Notes (Signed)
John at [facility] contacted ok to have Kathleen Foster transported to their facility

## 2016-03-03 NOTE — ED Notes (Signed)
Pt reports while preparing for bed she was brushing her teeth when her nose began bleeding . She further reports seen at [facility] one day ago for same but bleeding was worse then.

## 2016-03-03 NOTE — ED Notes (Signed)
Pt states [facility] should send someone over to take her back. [facility] contacted and are sending a transporter. Pt made aware. Pt assisted to sit in chair per request.

## 2016-03-03 NOTE — ED Notes (Signed)
[facility] contacted for transport back to nsg facility.

## 2016-03-03 NOTE — Discharge Instructions (Signed)
You were seen today for nosebleed.  The friable tissue in her nose was cauterized. Continue saline twice daily. Avoid sticking any objects in her nose. Use a humidifier. Follow-up with ENT as needed.  Nosebleed Nosebleeds are common. They are due to a crack in the inside lining of your nose (mucous membrane) or from a small blood vessel that starts to bleed. Nosebleeds can be caused by many conditions, such as injury, infections, dry mucous membranes or dry climate, medicines, nose picking, and home heating and cooling systems. Most nosebleeds come from blood vessels in the front of your nose. HOME CARE INSTRUCTIONS   Try controlling your nosebleed by pinching your nostrils gently and continuously for at least 10 minutes.  Avoid blowing or sniffing your nose for a number of hours after having a nosebleed.  Do not put gauze inside your nose yourself. If your nose was packed by your health care provider, try to maintain the pack inside of your nose until your health care provider removes it.  If a gauze pack was used and it starts to fall out, gently replace it or cut off the end of it.  If a balloon catheter was used to pack your nose, do not cut or remove it unless your health care provider has instructed you to do that.  Avoid lying down while you are having a nosebleed. Sit up and lean forward.  Use a nasal spray decongestant to help with a nosebleed as directed by your health care provider.  Do not use petroleum jelly or mineral oil in your nose. These can drip into your lungs.  Maintain humidity in your home by using less air conditioning or by using a humidifier.  Aspirinand blood thinners make bleeding more likely. If you are prescribed these medicines and you suffer from nosebleeds, ask your health care provider if you should stop taking the medicines or adjust the dose. Do not stop medicines unless directed by your health care provider  Resume your normal activities as you are  able, but avoid straining, lifting, or bending at the waist for several days.  If your nosebleed was caused by dry mucous membranes, use over-the-counter saline nasal spray or gel. This will keep the mucous membranes moist and allow them to heal. If you must use a lubricant, choose the water-soluble variety. Use it only sparingly, and do not use it within several hours of lying down.  Keep all follow-up visits as directed by your health care provider. This is important. SEEK MEDICAL CARE IF:  You have a fever.  You get frequent nosebleeds.  You are getting nosebleeds more often. SEEK IMMEDIATE MEDICAL CARE IF:  Your nosebleed lasts longer than 20 minutes.  Your nosebleed occurs after an injury to your face, and your nose looks crooked or broken.  You have unusual bleeding from other parts of your body.  You have unusual bruising on other parts of your body.  You feel light-headed or you faint.  You become sweaty.  You vomit blood.  Your nosebleed occurs after a head injury.   This information is not intended to replace advice given to you by your health care provider. Make sure you discuss any questions you have with your health care provider.   Document Released: 06/03/2005 Document Revised: 09/14/2014 Document Reviewed: 04/09/2014 Elsevier Interactive Patient Education [employer].

## 2016-03-04 ENCOUNTER — Telehealth (HOSPITAL_BASED_OUTPATIENT_CLINIC_OR_DEPARTMENT_OTHER): Payer: Self-pay | Admitting: *Deleted

## 2016-03-04 NOTE — ED Notes (Signed)
Pt called with concerns that she could not get an immediate appointment with the ENT referral from her d/c instructions. Instructions reviewed with pt. Encouraged pt to continue to talk with Dr Redmond Baseman office to try to make an earlier appointment if possible and also to return to the ED if needed

## 2021-01-30 ENCOUNTER — Emergency Department (HOSPITAL_COMMUNITY): Payer: Medicare Other

## 2021-01-30 ENCOUNTER — Emergency Department (HOSPITAL_COMMUNITY)
Admission: EM | Admit: 2021-01-30 | Discharge: 2021-01-30 | Disposition: A | Discharge status: Home / Self Care | Payer: Medicare Other | Attending: Emergency Medicine | Admitting: Emergency Medicine

## 2021-01-30 ENCOUNTER — Other Ambulatory Visit: Payer: Self-pay

## 2021-01-30 ENCOUNTER — Encounter (HOSPITAL_COMMUNITY): Payer: Self-pay | Admitting: Emergency Medicine

## 2021-01-30 DIAGNOSIS — S4991XA Unspecified injury of right shoulder and upper arm, initial encounter: Secondary | ICD-10-CM | POA: Diagnosis present

## 2021-01-30 DIAGNOSIS — R0781 Pleurodynia: Secondary | ICD-10-CM | POA: Insufficient documentation

## 2021-01-30 DIAGNOSIS — S42201A Unspecified fracture of upper end of right humerus, initial encounter for closed fracture: Secondary | ICD-10-CM | POA: Diagnosis not present

## 2021-01-30 DIAGNOSIS — I10 Essential (primary) hypertension: Secondary | ICD-10-CM | POA: Diagnosis not present

## 2021-01-30 DIAGNOSIS — W06XXXA Fall from bed, initial encounter: Secondary | ICD-10-CM | POA: Diagnosis not present

## 2021-01-30 DIAGNOSIS — R296 Repeated falls: Secondary | ICD-10-CM | POA: Insufficient documentation

## 2021-01-30 LAB — CBC
HCT: 42.7 % (ref 36.0–46.0)
Hemoglobin: 14.3 g/dL (ref 12.0–15.0)
MCH: 30.3 pg (ref 26.0–34.0)
MCHC: 33.5 g/dL (ref 30.0–36.0)
MCV: 90.5 fL (ref 80.0–100.0)
Platelets: 216 10*3/uL (ref 150–400)
RBC: 4.72 MIL/uL (ref 3.87–5.11)
RDW: 14.4 % (ref 11.5–15.5)
WBC: 11.9 10*3/uL — ABNORMAL HIGH (ref 4.0–10.5)
nRBC: 0 % (ref 0.0–0.2)

## 2021-01-30 LAB — URINALYSIS, ROUTINE W REFLEX MICROSCOPIC
Bilirubin Urine: NEGATIVE
Glucose, UA: 50 mg/dL — AB
Hgb urine dipstick: NEGATIVE
Ketones, ur: 20 mg/dL — AB
Leukocytes,Ua: NEGATIVE
Nitrite: NEGATIVE
Protein, ur: NEGATIVE mg/dL
Specific Gravity, Urine: 1.009 (ref 1.005–1.030)
pH: 6 (ref 5.0–8.0)

## 2021-01-30 LAB — BASIC METABOLIC PANEL
Anion gap: 9 (ref 5–15)
BUN: 20 mg/dL (ref 8–23)
CO2: 20 mmol/L — ABNORMAL LOW (ref 22–32)
Calcium: 9 mg/dL (ref 8.9–10.3)
Chloride: 109 mmol/L (ref 98–111)
Creatinine, Ser: 0.63 mg/dL (ref 0.44–1.00)
GFR, Estimated: >60 mL/min (ref >60)
Glucose, Bld: 130 mg/dL — ABNORMAL HIGH (ref 70–99)
Potassium: 3.5 mmol/L (ref 3.5–5.1)
Sodium: 138 mmol/L (ref 135–145)

## 2021-01-30 MED ORDER — HYDROCODONE-ACETAMINOPHEN 5-325 MG PO TABS: 1.0000 | ORAL_TABLET | Freq: Four times a day (QID) | ORAL | 0 refills | Status: AC | PRN

## 2021-01-30 MED ORDER — PROPOFOL 10 MG/ML IV BOLUS
1.0000 mg/kg | Freq: Once | INTRAVENOUS | Status: AC
  Administered 2021-01-30: 40 mg via INTRAVENOUS
  Filled 2021-01-30: qty 20

## 2021-01-30 MED ORDER — HYDROCODONE-ACETAMINOPHEN 5-325 MG PO TABS
1.0000 | ORAL_TABLET | Freq: Once | ORAL | Status: AC
Start: 2021-01-30 — End: 2021-01-30
  Administered 2021-01-30: 1 via ORAL
  Filled 2021-01-30: qty 1

## 2021-01-30 NOTE — ED Provider Notes (Signed)
Social work has been able to change her status at her facility to rehab.  Patient is now stable for discharge.  Plans to have her follow-up with her orthopedist next week, relative to the humerus fracture.  Recommend continue splinting, and Tylenol for pain.  Return here if needed.   Daleen Bo, MD 01/30/21 [phone]

## 2021-01-30 NOTE — Progress Notes (Signed)
TOC CSW received a call from [facility].  Information requested by Jenny Reichmann is available via hub.  On returning to [facility] the following will be shared with Maretta Bees, RN.  Room #:  361  Call Report#:  [phone]  Lesha Jager Tarpley-Carter, MSW, LCSW-A Pronouns:  She, Her, Hers                  [facility] Trans[facility] of [place] Worker [email or web address] [phone]

## 2021-01-30 NOTE — ED Notes (Signed)
06:39 - Dr. Sedonia Small at bedside, Lawerance Bach RT, Junior RN at bedside. Consent signed. O2 set up, ambubag set up, suction set up. 06:41- Time Out 06:43 20mg  Propofol IV verbal order Dr. Sedonia Small at bedside 06:44 20mg  Propofol IV verbal order Dr. Sedonia Small at bedside 06:47 Elbow in place 06:48 sedation ended

## 2021-01-30 NOTE — Progress Notes (Signed)
TOC CSW attempted to contact [facility].  CSW left HIPPA compliant message with my contact information.   Quilla Freeze Tarpley-Carter, MSW, LCSW-A Pronouns:  She, Her, Hers                  [facility] ED Transitions of CareClinical Social Worker [email or web address] [phone]

## 2021-01-30 NOTE — ED Notes (Signed)
Spoke with Melany Guernsey, daughter, and updated her on pt's waiting status for transport home. Daughter expressed concern about pt eating, will offer food to pt

## 2021-01-30 NOTE — ED Notes (Signed)
Called [facility] to get ETA on pick up time. [facility] states the patient is third in line to be picked up

## 2021-01-30 NOTE — ED Notes (Addendum)
Called report to [place], Therapist, sports at [facility]

## 2021-01-30 NOTE — ED Notes (Signed)
[facility] called for transportation needs.

## 2021-01-30 NOTE — NC FL2 (Signed)
[place] LEVEL OF CARE SCREENING TOOL     IDENTIFICATION  Patient Name: Kathleen Foster Birthdate: Jan 16, 1931 Sex: female Admission Date (Current Location): 01/30/2021  [facility] and [place] Number:  Herbalist and Address:  [facility],  [address], [place]      Provider Number: [id number]  Attending Physician Name and Address:  Daleen Bo, MD  Relative Name and Phone Number:  Maudry Diego [phone]    Current Level of Care: Hospital Recommended Level of Care: [place] Prior Approval Number:    Date Approved/Denied:   PASRR Number: [id number]  Discharge Plan: SNF    Current Diagnoses: Patient Active Problem List   Diagnosis Date Noted  . Cataract 01/02/2014  . Macular edema 01/02/2014  . Closed angle glaucoma 03/07/2013  . Essential hypertension, benign 03/07/2013  . History of atrial fibrillation 06/09/2012  . PVC (premature ventricular contraction) 06/09/2012  . Migraine headache without aura 06/09/2012    Orientation RESPIRATION BLADDER Height & Weight     Self,Situation,Place  Normal Continent Weight: 130 lb (59 kg) Height:  5\' 4"  (162.6 cm)  BEHAVIORAL SYMPTOMS/MOOD NEUROLOGICAL BOWEL NUTRITION STATUS      Continent Diet (NPO until 01/30/2021)  AMBULATORY STATUS COMMUNICATION OF NEEDS Skin   Limited Assist Verbally Normal                       Personal Care Assistance Level of Assistance  Bathing,Feeding,Dressing Bathing Assistance: Limited assistance Feeding assistance: Independent Dressing Assistance: Limited assistance     Functional Limitations Info  Sight,Hearing,Speech Sight Info: Adequate Hearing Info: Adequate Speech Info: Adequate    SPECIAL CARE FACTORS FREQUENCY  PT (By licensed PT),OT (By licensed OT)     PT Frequency: 5x a week OT Frequency: 5x a week            Contractures Contractures Info: Not present    Additional Factors Info  Code  Status,Allergies Code Status Info: Full Code Allergies Info: Amlodipine, Aspirin, Brimonidine, Dorzolamide, Erythromycin, Fluorescein-benoxinate, Lisinopril, Losartan, Pilocarpine, Prednisone, and Hydroclorothiazide           Current Medications (01/30/2021):  This is the current hospital active medication list No current facility-administered medications for this encounter.   Current Outpatient Medications  Medication Sig Dispense Refill  . ascorbic acid (VITAMIN C) 500 MG tablet Take 500 mg by mouth as needed.     . Cholecalciferol (VITAMIN D3) 5000 units TABS Take 1 tablet by mouth daily.    . DOCOSAHEXAENOIC ACID PO Take 1 capsule by mouth daily.    Marland Kitchen latanoprost (XALATAN) 0.005 % ophthalmic solution Place 1 drop into both eyes daily.    Marland Kitchen losartan (COZAAR) 25 MG tablet Take 25 mg by mouth daily.    . Magnesium Oxide, Antacid, 500 MG CAPS Take 250 mg by mouth.    . propranolol (INDERAL) 20 MG tablet TAKE ONE TABLET TWICE DAILY (Patient taking differently: TAKE ONE HALF ( 0.5)  TABLET TWICE DAILY) 60 tablet 2  . Riboflavin (B-2) 100 MG TABS Take 50 mg by mouth daily.    Marland Kitchen thiamine (VITAMIN B-1) 100 MG tablet Take 50 mg by mouth daily.    . timolol (TIMOPTIC) 0.5 % ophthalmic solution Place 1 drop into both eyes daily.    Marland Kitchen triamcinolone ointment (KENALOG) 0.1 %        Discharge Medications: Please see discharge summary for a list of discharge medications.  Relevant Imaging Results:  Relevant  Lab Results:   Additional Information SSN#:  [id number] Ht:  5'4"  Wt:  130lbs, and BMI:  22.31kg  Christoper Bushey C Tarpley-Carter, LCSWA

## 2021-01-30 NOTE — Progress Notes (Signed)
..   Transition of Care [facility]) - Emergency Department Mini Assessment   Patient Details  Name: Kathleen Foster MRN: [id number] Date of Birth: Oct 28, 1930  Transition of Care [facility] CM/SW Contact:    Twana Wileman C Tarpley-Carter, LCSWA Phone Number: 01/30/2021, 11:18 AM   Clinical Narrative: TOC CSW spoke with pts daughter/Helen Overmyer,POA.  Bonnita Nasuti stated that she had spoken with Tawni Carnes and Hazle Nordmann at [facility].  Regarding pt returning to [facility] for rehab.  CSW will contact both Hawks and Marks at [employer].  Tipton Ballow Tarpley-Carter, MSW, LCSW-A Pronouns:  She, Her, Hers                  [place] ED Transitions of CareClinical Social Worker [email or web address] [phone]   ED Mini Assessment: What brought you to the Emergency Department? : Shoulder Pain  Barriers to Discharge: No Barriers Identified        Interventions which prevented an admission or readmission: SNF Placement    Patient Contact and Communications Key Contact 1: Jesse Fall   Spoke with: Jesse Fall Contact Date: 01/30/21,     Contact Phone Number: [phone] Call outcome: CSW inquired to [facility] the dc plan for pt.  Bonnita Nasuti would like pts dc to [facility].  Patient states their goals for this hospitalization and ongoing recovery are:: Return to [facility] CMS Medicare.gov Compare Post Acute Care list provided to:: Other (Comment Required) Choice offered to / list presented to : NA  Admission diagnosis:  Fall Patient Active Problem List   Diagnosis Date Noted  . Cataract 01/02/2014  . Macular edema 01/02/2014  . Closed angle glaucoma 03/07/2013  . Essential hypertension, benign 03/07/2013  . History of atrial fibrillation 06/09/2012  . PVC (premature ventricular contraction) 06/09/2012  . Migraine headache without aura 06/09/2012   PCP:  Linus Mako, NP Pharmacy:   [facility], [place] - [address]  [place] [address] Phone: [phone] Fax: [place], [place] [address] [place] [address] Phone: [phone] Fax: [phone]

## 2021-01-30 NOTE — ED Notes (Signed)
Attempted to call report to Nicole Kindred, RN at [facility] with no answer.

## 2021-01-30 NOTE — Evaluation (Signed)
Physical Therapy Evaluation Patient Details Name: Kathleen Foster MRN: [id number] DOB: 09-26-1930 Today's Date: 01/30/2021   History of Present Illness  Kathleen Foster is a 87 y.o. year-old female with a history of A. fib, hypertension presenting to the ED with chief complaint of fall, sustained closed fracture of proximal  right humerus, reduced in ED.Post reduction xray still with displacement but patient  will follow up with ortho  Clinical Impression  The patient is very anxious, asking for water. CNA brought it in. Patient required max assistance to sit up on bed side, did not attempt to stand due to gurney height . Patient clinging  Onto therapist while sitting up.  Patient has had falls, lives in independent living. Pt admitted with above diagnosis. Pt currently with functional limitations due to the deficits listed below (see PT Problem List). Pt will benefit from skilled PT to increase their independence and safety with mobility to allow discharge to the venue listed below.       Follow Up Recommendations SNF    Equipment Recommendations  None recommended by PT    Recommendations for Other Services       Precautions / Restrictions Precautions Precautions: Fall Precaution Comments: low vision Required Braces or Orthoses: Sling Restrictions Weight Bearing Restrictions: Yes RUE Weight Bearing: Non weight bearing      Mobility  Bed Mobility Overal bed mobility: Needs Assistance Bed Mobility: Supine to Sit;Sit to Supine     Supine to sit: Mod assist Sit to supine: Mod assist   General bed mobility comments: patient pulled up onto therapist's hand, able to boost hips in bed pan lift to replace wet chux.    Transfers                 General transfer comment: NT, on tall gurney, will need 2 person assist to stand  Ambulation/Gait             General Gait Details: NT  Stairs            Wheelchair Mobility    Modified Rankin (Stroke Patients Only)        Balance Overall balance assessment: Needs assistance;History of Falls Sitting-balance support: Feet unsupported;Single extremity supported Sitting balance-Leahy Scale: Poor Sitting balance - Comments: clinging onto therapists arm for balance Postural control: Posterior lean     Standing balance comment: unable                             Pertinent Vitals/Pain Pain Assessment: Faces Faces Pain Scale: Hurts worst Pain Location: right shoulder when moving/repositioning Pain Descriptors / Indicators: Aching;Discomfort;Moaning Pain Intervention(s): Monitored during session;Repositioned;Limited activity within patient's tolerance    Home Living   Living Arrangements: Alone   Type of Home: Apartment Home Access: Level entry     Home Layout: One level Home Equipment: Walker - 2 wheels Additional Comments: from [place].    Prior Function Level of Independence: Independent         Comments: has had multiple falls     Hand Dominance   Dominant Hand: Right    Extremity/Trunk Assessment   Upper Extremity Assessment Upper Extremity Assessment: RUE deficits/detail RUE Deficits / Details: moves hand, can hold cup, in sling    Lower Extremity Assessment Lower Extremity Assessment: Generalized weakness    Cervical / Trunk Assessment Cervical / Trunk Assessment: Kyphotic  Communication   Communication: No difficulties  Cognition Arousal/Alertness: Awake/alert Behavior During Therapy: WFL for  tasks assessed/performed;Anxious Overall Cognitive Status: Within Functional Limits for tasks assessed                                 General Comments: basic time off from being in ED overnight.      General Comments      Exercises     Assessment/Plan    PT Assessment Patient needs continued PT services  PT Problem List Decreased strength;Decreased mobility;Decreased safety awareness;Decreased range of motion;Decreased knowledge  of precautions;Decreased activity tolerance;Pain;Decreased balance       PT Treatment Interventions DME instruction;Therapeutic activities;Gait training;Therapeutic exercise;Patient/family education;Functional mobility training;Balance training    PT Goals (Current goals can be found in the Care Plan section)  Acute Rehab PT Goals Patient Stated Goal: to get some water, go to rehab but not today. PT Goal Formulation: With patient Time For Goal Achievement: 02/13/21 Potential to Achieve Goals: Fair    Frequency Min 2X/week   Barriers to discharge        Co-evaluation               AM-PAC PT "6 Clicks" Mobility  Outcome Measure Help needed turning from your back to your side while in a flat bed without using bedrails?: Total Help needed moving from lying on your back to sitting on the side of a flat bed without using bedrails?: Total Help needed moving to and from a bed to a chair (including a wheelchair)?: Total Help needed standing up from a chair using your arms (e.g., wheelchair or bedside chair)?: Total Help needed to walk in hospital room?: Total Help needed climbing 3-5 steps with a railing? : Total 6 Click Score: 6    End of Session   Activity Tolerance: Patient limited by pain Patient left: in bed;with call bell/phone within reach Nurse Communication: Mobility status PT Visit Diagnosis: Unsteadiness on feet (R26.81);Repeated falls (R29.6)    Time: 4097-3532 PT Time Calculation (min) (ACUTE ONLY): 34 min   Charges:   PT Evaluation $PT Eval Moderate Complexity: 1 Mod PT Treatments $Therapeutic Activity: 8-22 mins        Tresa Endo PT Acute Rehabilitation Services Pager [phone] Office [phone]  Claretha Cooper 01/30/2021, 12:31 PM

## 2021-01-30 NOTE — Progress Notes (Signed)
TOC CSW attempted to contact [facility] Hawks/[facility] [phone].  CSW left HIPPA compliant message with my contact information.  Doraine Schexnider Tarpley-Carter, MSW, LCSW-A Pronouns:  She, Her, [place] ED Transitions of CareClinical Social Worker [email or web address] [phone]

## 2021-01-30 NOTE — Discharge Instructions (Addendum)
You were evaluated in the Emergency Department and after careful evaluation, we did not find any emergent condition requiring admission or further testing in the hospital.  Your x-ray showed a broken bone in the right arm.  Please keep the sling and follow-up with the orthopedic specialists within the next few days.  Call the number provided to set up a follow-up appointment.  Please return to the Emergency Department if you experience any worsening of your condition.  Thank you for allowing [place] to be a part of your care.

## 2021-01-30 NOTE — ED Provider Notes (Addendum)
[name]:  [id number]  Arrival date & time: 01/30/21     Chief Complaint   Fall   History of Present Illness   Kathleen Foster is a 87 y.o. year-old female with a history of A. fib, hypertension presenting to the ED with chief complaint of fall.  Patient was walking in the dark and fell onto her right side.  Had trouble getting up due to the pain.  Pain is mostly in the right shoulder but includes the right side of her body.  Denies head trauma or loss of consciousness.  Denies blood thinners.  Denies chest pain or shortness of breath but does endorse right rib pain.  No abdominal pain or tenderness, no injuries to the legs.  Pain in the right shoulder is severe, worse with motion or palpation, constant.  Review of Systems  A complete 10 system review of systems was obtained and all systems are negative except as noted in the HPI and PMH.   Patient's Health History    Past Medical History:  Diagnosis Date  . Atrial fibrillation ([place])   . Gallstones 1977  . GERD (gastroesophageal reflux disease)   . Glaucoma   . Glaucoma (increased eye pressure)   . Hemorrhoid   . Hypertension   . PVC's (premature ventricular contractions)     Past Surgical History:  Procedure Laterality Date  . BLADDER SURGERY  1977   Chronic Pseudomebranous , urethritis, bladder neck, scar tissue  . CHOLECYSTECTOMY    . cyrosurgery for squamous cells  1974  . D & C for periods  1974, 1976, 1981  . OOPHORECTOMY  1977   right ovary, exploratory for abd pain, ovary cyst  . Rt foot surgery Right     Family History  Problem Relation Age of Onset  . Heart disease Mother   . Colon cancer Sister 75  . Stroke Father   . Diabetes Other        Uncle    Social History   Socioeconomic History  . Marital status: Married    Spouse name: Hassell Done  . Number of children: 2  . Years of education: BA  . Highest education level: Not on file   Occupational History  . Occupation: retired Proofreader  Tobacco Use  . Smoking status: Never Smoker  . Smokeless tobacco: Never Used  Vaping Use  . Vaping Use: Never used  Substance and Sexual Activity  . Alcohol use: No  . Drug use: No  . Sexual activity: Not Currently  Other Topics Concern  . Not on file  Social History Narrative   Masters in [place] in [place].    Social Determinants of Health   Financial Resource Strain: Not on file  Food Insecurity: Not on file  Transportation Needs: Not on file  Physical Activity: Not on file  Stress: Not on file  Social Connections: Not on file  Intimate Partner Violence: Not on file     Physical Exam   Vitals:   01/30/21 0655 01/30/21 0700  BP: 126/66 103/87  Pulse: 60 68  Resp: 15 14  Temp:    SpO2: 98% 98%    CONSTITUTIONAL: Well-appearing, NAD NEURO:  Alert and oriented x 3, no focal deficits EYES:  eyes equal and reactive ENT/NECK:  no LAD, no JVD CARDIO: Regular rate, well-perfused, normal S1 and S2 PULM:  CTAB no wheezing or rhonchi GI/GU:  normal bowel sounds, non-distended, non-tender MSK/SPINE:  No gross deformities, no  edema SKIN:  no rash, atraumatic PSYCH:  Appropriate speech and behavior  *Additional and/or pertinent findings included in MDM below  Diagnostic and Interventional Summary    EKG Interpretation  Date/Time:    Ventricular Rate:    PR Interval:    QRS Duration:   QT Interval:    QTC Calculation:   R Axis:     Text Interpretation:        Labs Reviewed  URINALYSIS, ROUTINE W REFLEX MICROSCOPIC - Abnormal; Notable for the following components:      Result Value   Color, Urine STRAW (*)    Glucose, UA 50 (*)    Ketones, ur 20 (*)    All other components within normal limits  CBC - Abnormal; Notable for the following components:   WBC 11.9 (*)    All other components within normal limits  BASIC METABOLIC PANEL - Abnormal; Notable for the following components:   CO2 20 (*)     Glucose, Bld 130 (*)    All other components within normal limits    CT Chest Wo Contrast  Final Result    CT HEAD WO CONTRAST  Final Result    CT CERVICAL SPINE WO CONTRAST  Final Result    DG Shoulder Right  Final Result    DG Shoulder Right Portable    (Results Pending)    Medications  propofol (DIPRIVAN) 10 mg/mL bolus/IV push 59 mg (40 mg Intravenous Given 01/30/21 0656)     Procedures  /  Critical Care .Sedation  Date/Time: 01/30/2021 6:59 AM Performed by: Maudie Flakes, MD Authorized by: Maudie Flakes, MD   Consent:    Consent obtained:  Verbal and written   Consent given by:  Healthcare agent and patient   Risks discussed:  Allergic reaction, dysrhythmia, inadequate sedation, nausea, vomiting, respiratory compromise necessitating ventilatory assistance and intubation and prolonged hypoxia resulting in organ damage Universal protocol:    Immediately prior to procedure, a time out was called: yes     Patient identity confirmed:  Arm band and hospital-assigned identification number Indications:    Procedure performed:  Fracture reduction   Procedure necessitating sedation performed by:  Physician performing sedation Pre-sedation assessment:    Time since last food or drink:  4 hours   ASA classification: class 2 - patient with mild systemic disease     Mouth opening:  3 or more finger widths   Mallampati score:  I - soft palate, uvula, fauces, pillars visible   Neck mobility: normal     Pre-sedation assessments completed and reviewed: airway patency, cardiovascular function, hydration status, mental status, nausea/vomiting, pain level, respiratory function and temperature   Immediate pre-procedure details:    Reassessment: Patient reassessed immediately prior to procedure     Reviewed: vital signs, relevant labs/tests and NPO status     Verified: bag valve mask available, emergency equipment available, intubation equipment available, IV patency confirmed, oxygen  available and suction available   Procedure details (see MAR for exact dosages):    Preoxygenation:  Nasal cannula   Sedation:  Propofol   Intended level of sedation: deep   Intra-procedure monitoring:  Blood pressure monitoring, cardiac monitor, continuous pulse oximetry, continuous capnometry, frequent LOC assessments and frequent vital sign checks   Intra-procedure events: none     Total Provider sedation time (minutes):  17 Post-procedure details:    Attendance: Constant attendance by certified staff until patient recovered     Recovery: Patient returned to pre-procedure baseline  Post-sedation assessments completed and reviewed: airway patency, cardiovascular function, hydration status, mental status, nausea/vomiting, pain level, respiratory function and temperature     Patient is stable for discharge or admission: yes     Procedure completion:  Tolerated well, no immediate complications Reduction of fracture  Date/Time: 01/30/2021 7:01 AM Performed by: Maudie Flakes, MD Authorized by: Maudie Flakes, MD  Consent: Verbal consent obtained. Written consent obtained. Risks and benefits: risks, benefits and alternatives were discussed Consent given by: patient and power of attorney Patient understanding: patient states understanding of the procedure being performed Imaging studies: imaging studies available Patient identity confirmed: verbally with patient and arm band Time out: Immediately prior to procedure a "time out" was called to verify the correct patient, procedure, equipment, support staff and site/side marked as required. Local anesthesia used: no  Anesthesia: Local anesthesia used: no  Sedation: Patient sedated: yes Vitals: Vital signs were monitored during sedation.  Patient tolerance: patient tolerated the procedure well with no immediate complications Comments: Reduction of proximal humerus fracture     ED Course and Medical Decision Making  I have  reviewed the triage vital signs, the nursing notes, and pertinent available records from the EMR.  Listed above are laboratory and imaging tests that I personally ordered, reviewed, and interpreted and then considered in my medical decision making (see below for details).  Fall, displaced humerus fracture, question of rib fractures of unclear chronicity, will follow-up with CT imaging given the tenderness on exam.  Will discuss plan of action with regard to the humerus with orthopedics.     Discussed with Dr. Ralene Bathe, see procedural details above.  Overall we discussed that this would be a difficult reduction given the comminuted nature.  Plan is to go forward with a single reduction attempt and patient will follow-up in the office soon.    Post reduction xray still with displacement but patient is NVIT will follow up with ortho.  Patient is without indication for admission but her needs have now significantly changed.  Will consult TOC to see if she can be transition from independent living to rehab at her care facility.  Signed out to default provider.  Barth Kirks. [place] Small, [place] [email or web address]  Final Clinical Impressions(s) / ED Diagnoses     ICD-10-CM   1. Closed fracture of proximal end of right humerus, unspecified fracture morphology, initial encounter  S42.201A     ED Discharge Orders    None       Discharge Instructions Discussed with and Provided to Patient:   Discharge Instructions   None       Maudie Flakes, MD 01/30/21 1308    Maudie Flakes, MD 01/30/21 6578    Maudie Flakes, MD 01/30/21 [phone]

## 2021-01-30 NOTE — ED Notes (Signed)
Called daughter to get transportation arranged.

## 2021-01-30 NOTE — ED Triage Notes (Signed)
Patient comes from [facility] in [place]. Patient fell out of bed at 12 am. Patient complaining of right shoulder pain. Patient denies hitting her head.

## 2021-01-30 NOTE — ED Notes (Signed)
Right shoulder immobilizer placed.

## 2021-01-30 NOTE — ED Provider Notes (Signed)
Facility is requesting an Rx for pain medications. Norco prescription given at discharge.   Truddie Hidden, MD 01/30/21 [phone]

## 2021-01-30 NOTE — ED Notes (Signed)
I assisted patient with some soup

## 2021-01-30 NOTE — ED Notes (Signed)
Kathleen Foster, [phone]. Daughter and healthcare power of attorney wants to be called with any update on the plan for patient.

## 2021-01-30 NOTE — Progress Notes (Signed)
TOC CSW attempted to contact [employer] [phone].   CSW left HIPPA compliant message with my contact information.   Johany Hansman Tarpley-Carter, MSW, LCSW-A Pronouns:  She, Her, Hers                  [place] ED Transitions of CareClinical Social Worker [email or web address] [phone]

## 2022-05-06 IMAGING — CT CT HEAD W/O CM
3 of 4 series · 13 of 47 positions shown, 15 images · non-contrast
Comparison: X-ray thoracolumbar spine 05/05/2018, x-ray C-spine
02/19/2014.

CLINICAL DATA: Unwitnessed fall.

EXAM:
CT HEAD WITHOUT CONTRAST
CT CERVICAL SPINE WITHOUT CONTRAST
CT CHEST WITHOUT CONTRAST
TECHNIQUE: Multidetector CT imaging of the head and cervical spine was
performed following the standard protocol without intravenous
contrast. Multiplanar CT image reconstructions of the cervical spine
were also generated.
Multidetector CT imaging of the chest was performed following the
standard protocol without IV contrast.

[Series 3: head wo · axial · 0.47mm/px · z∈[-192,-77]mm · 7 of 31 slices shown, 9 images]
[im 4/31  brain]
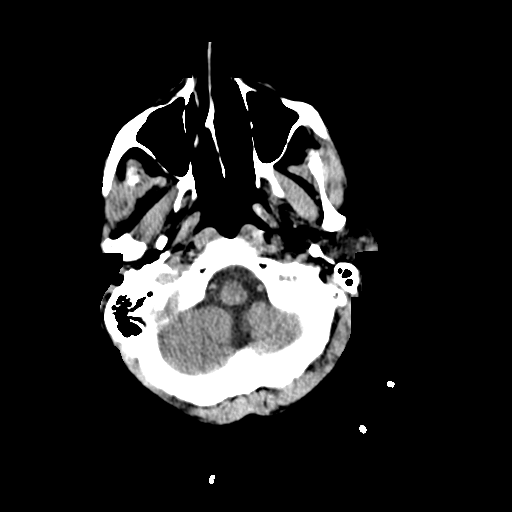
[im 4/31  bone]
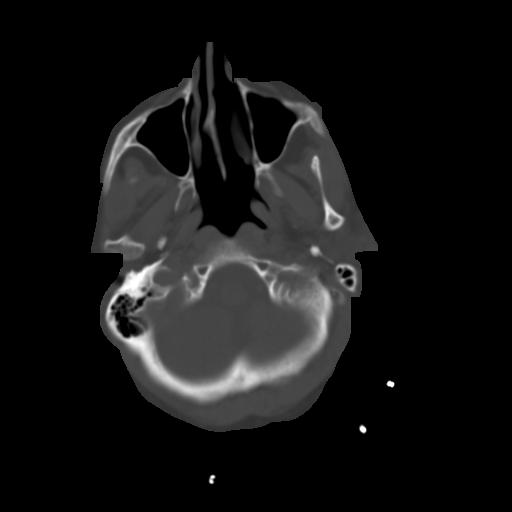
[im 8/31  brain]
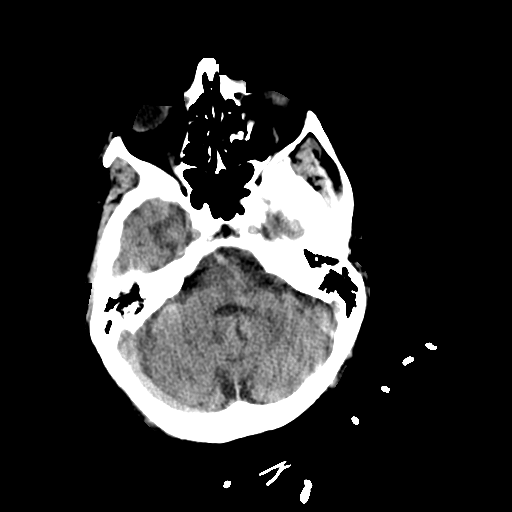
[im 12/31  brain]
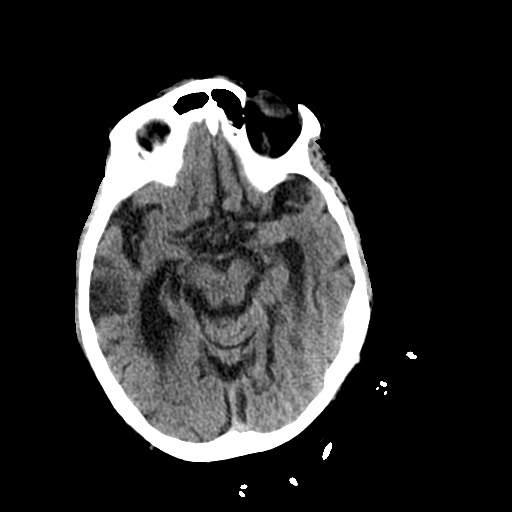
[im 16/31  brain]
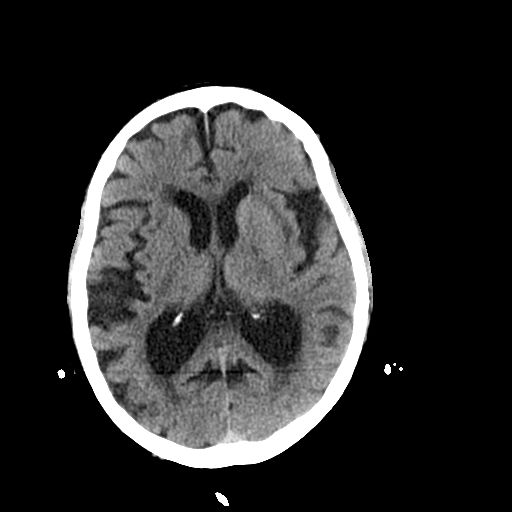
[im 19/31  brain]
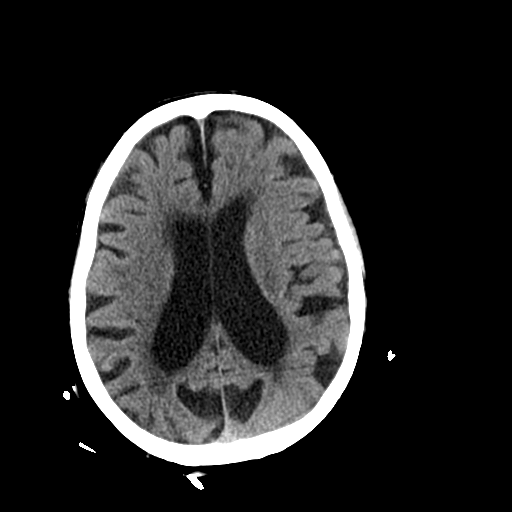
[im 19/31  bone]
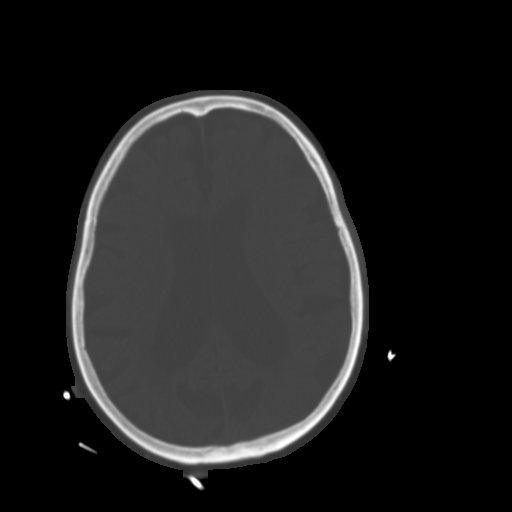
[im 23/31  brain]
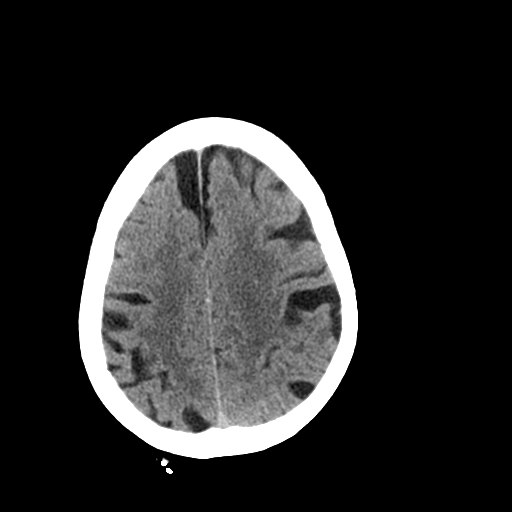
[im 27/31  brain]
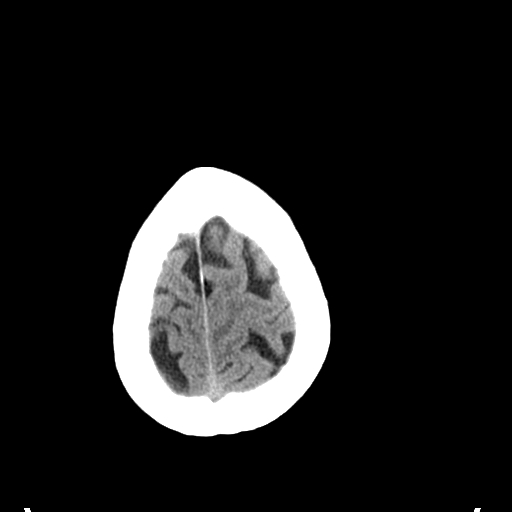

[Series 5: coronal soft tissue · coronal · 0.30mm/px · 3 of 62 slices shown]
[im 21/62  brain]
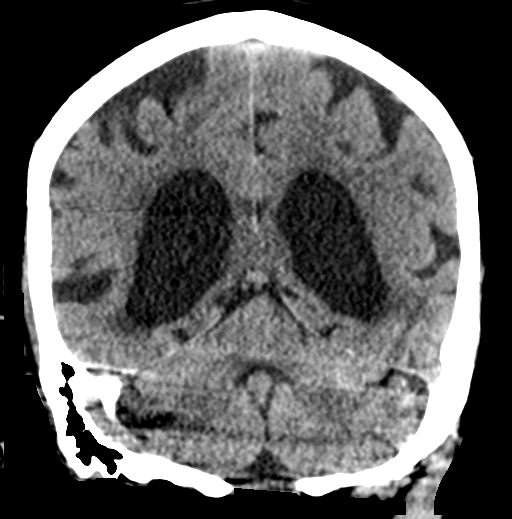
[im 28/62  brain]
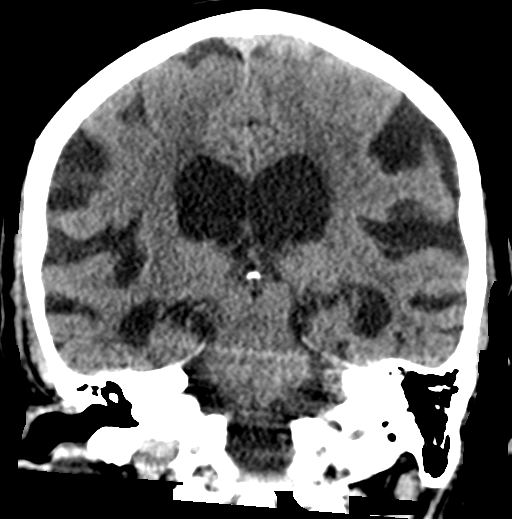
[im 34/62  brain]
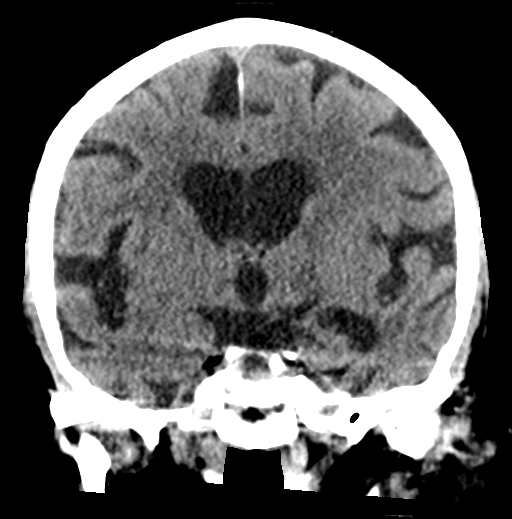

[Series 6: sagittal soft tissue · sagittal · 0.30mm/px · 3 of 48 slices shown]
[im 16/48  brain]
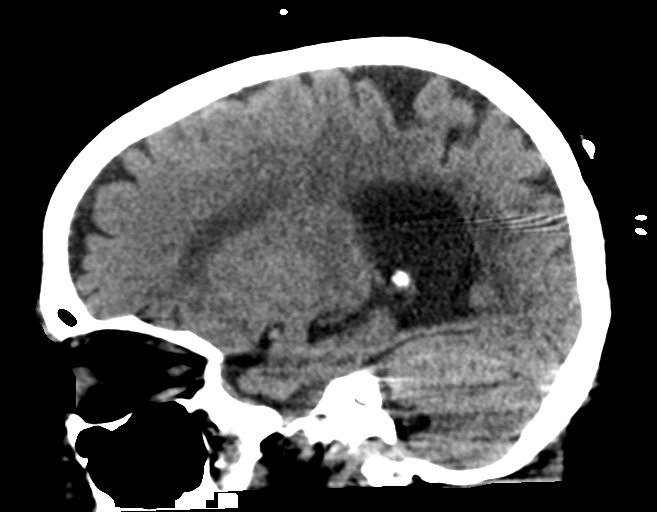
[im 24/48  brain]
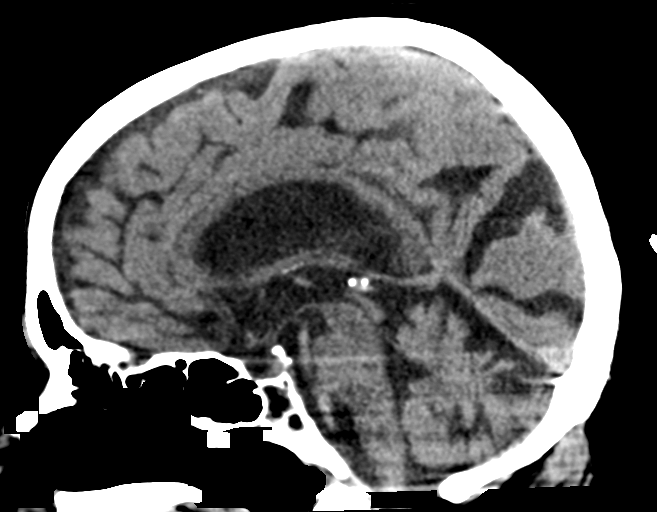
[im 32/48  brain]
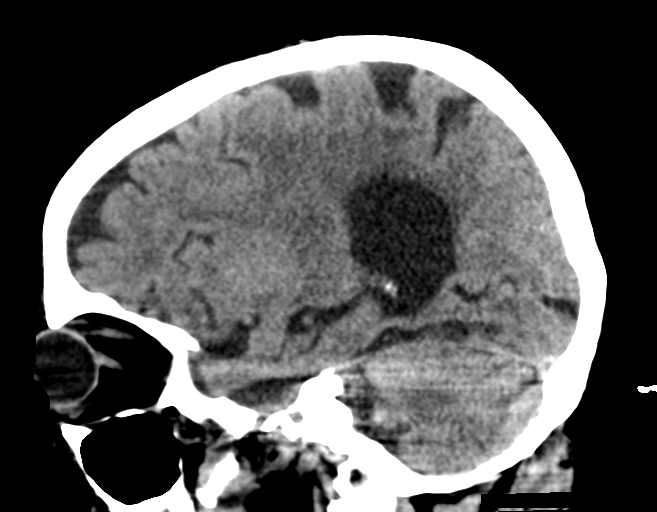

[13 of 47 positions shown; findings below may reference images not displayed]

FINDINGS: CT HEAD FINDINGS

Brain:

Cerebral ventricle sizes are concordant with the degree of cerebral
volume loss. Patchy and confluent areas of decreased attenuation are
noted throughout the deep and periventricular white matter of the
cerebral hemispheres bilaterally, compatible with chronic
microvascular ischemic disease.

No evidence of large-territorial acute infarction. No parenchymal
hemorrhage. No mass lesion. No extra-axial collection.

No mass effect or midline shift. No hydrocephalus. Basilar cisterns
are patent.

Vascular: No hyperdense vessel.

Skull: No acute fracture or focal lesion.

Sinuses/Orbits: Paranasal sinuses and mastoid air cells are clear.
The orbits are unremarkable.

Other: None.

CT CERVICAL SPINE FINDINGS

Alignment: Grade 1 anterolisthesis of C3 on C4 and C4 on C5.

Skull base and vertebrae: Multilevel severe degenerative changes of
the spine including osteophyte formation, intervertebral disc space
narrowing, facet arthropathy, uncovertebral arthropathy. Associated
severe left C3-C4 osseous neural foraminal stenosis. No severe
osseous central canal stenosis. No acute fracture. No aggressive
appearing focal osseous lesion or focal pathologic process.

Soft tissues and spinal canal: No prevertebral fluid or swelling. No
visible canal hematoma.

Upper chest: Unremarkable.

Other: None.

CHEST:
Ports and Devices: None.

Lungs/airways:

No focal consolidation. No pulmonary nodule. No pulmonary mass. No
pulmonary contusion or laceration. No pneumatocele formation.

The central airways are patent.

Pleura: No pleural effusion. No pneumothorax. No hemothorax.

Lymph Nodes: Limited evaluation for hilar lymphadenopathy on this
noncontrast study. No mediastinal or axillary lymphadenopathy.

Mediastinum:

No pneumomediastinum.

The thoracic aorta is normal in caliber. Mild atherosclerotic
plaque. At least left anterior descending coronary artery
calcifications. The heart is normal in size. No significant
pericardial effusion. The esophagus is patulous. Small hiatal
hernia.

The thyroid is unremarkable.

Chest Wall / Breasts: No chest wall mass.

Musculoskeletal: Partially visualized comminuted, impacted, and
displaced proximal right humeral fracture. No acute rib or sternal
fracture. Old healed anterior right fourth and fifth rib fractures.
Subacute minimally displaced posterolateral right 5-7 rib fractures.
No spinal fracture.

Visualized upper abdomen: Several fluid density lesions within the
hepatic parenchyma with the largest measuring up to 8.2 cm.
Nonspecific hepatic calcification.
IMPRESSION: 1. No acute intracranial abnormality.
2. No acute displaced fracture or traumatic listhesis of the
cervical spine.
3.  No acute traumatic injury to the chest.

4. No acute fracture or traumatic malalignment of the thoracic
spine.
5. Partially visualized comminuted, impacted, and displaced proximal
right humeral fracture.
6. Subacute minimally displaced posterolateral right 5-7 rib
fractures. No definite acute displaced rib fracture.
7. Other imaging findings of potential clinical significance:
Patulous esophagus. Small hiatal hernia. Multiple large hepatic
cysts. Severe multilevel degenerative changes of the spine with
associated left C3-C4 osseous neural foraminal stenosis. Aortic
Atherosclerosis ([id number]) including at least left anterior
descending coronary artery calcifications.
